# Patient Record
Sex: Male | Born: 1975 | Race: Black or African American | Hispanic: No
Health system: Southern US, Community
[De-identification: ages and names within clinical notes are randomized; demographics above are authoritative.]

## PROBLEM LIST (undated history)

## (undated) DIAGNOSIS — R569 Unspecified convulsions: Secondary | ICD-10-CM

## (undated) HISTORY — PX: CARDIAC SURGERY: SHX584

## (undated) HISTORY — DX: Unspecified convulsions: R56.9

---

## 2000-06-15 ENCOUNTER — Emergency Department (HOSPITAL_COMMUNITY): Admission: EM | Admit: 2000-06-15 | Discharge: 2000-06-15 | Payer: Self-pay

## 2002-10-28 ENCOUNTER — Emergency Department (HOSPITAL_COMMUNITY): Admission: EM | Admit: 2002-10-28 | Discharge: 2002-10-28 | Payer: Self-pay | Admitting: Emergency Medicine

## 2003-08-03 ENCOUNTER — Emergency Department (HOSPITAL_COMMUNITY): Admission: EM | Admit: 2003-08-03 | Discharge: 2003-08-03 | Payer: Self-pay | Admitting: Family Medicine

## 2003-08-03 ENCOUNTER — Emergency Department (HOSPITAL_COMMUNITY): Admission: EM | Admit: 2003-08-03 | Discharge: 2003-08-03 | Payer: Self-pay | Admitting: Emergency Medicine

## 2003-08-12 ENCOUNTER — Emergency Department (HOSPITAL_COMMUNITY): Admission: EM | Admit: 2003-08-12 | Discharge: 2003-08-12 | Payer: Self-pay | Admitting: Emergency Medicine

## 2009-02-18 ENCOUNTER — Emergency Department (HOSPITAL_COMMUNITY): Admission: EM | Admit: 2009-02-18 | Discharge: 2009-02-18 | Payer: Self-pay | Admitting: Emergency Medicine

## 2014-10-15 ENCOUNTER — Ambulatory Visit: Payer: Self-pay | Attending: Internal Medicine | Admitting: Internal Medicine

## 2014-10-15 ENCOUNTER — Encounter: Payer: Self-pay | Admitting: Internal Medicine

## 2014-10-15 VITALS — BP 118/81 | HR 62 | Temp 98.4°F | Resp 16 | Ht 69.0 in | Wt 186.6 lb

## 2014-10-15 DIAGNOSIS — H6123 Impacted cerumen, bilateral: Secondary | ICD-10-CM

## 2014-10-15 DIAGNOSIS — G40909 Epilepsy, unspecified, not intractable, without status epilepticus: Secondary | ICD-10-CM

## 2014-10-15 MED ORDER — LEVETIRACETAM 500 MG PO TABS: 500.0000 mg | ORAL_TABLET | Freq: Two times a day (BID) | ORAL | Status: DC
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
## 2014-10-15 NOTE — Progress Notes (Signed)
Patient recently moved back from [place]. Patient has history of seizures. Patient will need refferal to see neurologist.

## 2014-10-16 ENCOUNTER — Telehealth: Payer: Self-pay | Admitting: Internal Medicine

## 2014-10-16 ENCOUNTER — Ambulatory Visit: Payer: Self-pay | Attending: Internal Medicine

## 2014-10-16 NOTE — Telephone Encounter (Signed)
Patient needs to be referred to neurologist. Patient was last seen yesterday (10/15/14) and is hoping to be referred without an appointment if possible. Patient was approved for [employer]. Please f/u with patient.

## 2014-10-19 ENCOUNTER — Other Ambulatory Visit: Payer: Self-pay

## 2014-10-19 DIAGNOSIS — G40909 Epilepsy, unspecified, not intractable, without status epilepticus: Secondary | ICD-10-CM

## 2014-11-03 ENCOUNTER — Ambulatory Visit (INDEPENDENT_AMBULATORY_CARE_PROVIDER_SITE_OTHER): Payer: Self-pay | Admitting: Neurology

## 2014-11-03 ENCOUNTER — Encounter: Payer: Self-pay | Admitting: Neurology

## 2014-11-03 VITALS — BP 118/78 | HR 72 | Resp 16 | Ht 69.5 in | Wt 188.0 lb

## 2014-11-03 DIAGNOSIS — G40009 Localization-related (focal) (partial) idiopathic epilepsy and epileptic syndromes with seizures of localized onset, not intractable, without status epilepticus: Secondary | ICD-10-CM | POA: Insufficient documentation

## 2014-11-03 DIAGNOSIS — F411 Generalized anxiety disorder: Secondary | ICD-10-CM

## 2014-11-03 MED ORDER — LEVETIRACETAM 750 MG PO TABS: 750.0000 mg | ORAL_TABLET | Freq: Two times a day (BID) | ORAL | Status: DC
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
## 2014-11-04 ENCOUNTER — Ambulatory Visit (INDEPENDENT_AMBULATORY_CARE_PROVIDER_SITE_OTHER): Payer: Self-pay | Admitting: Neurology

## 2014-11-04 DIAGNOSIS — G40009 Localization-related (focal) (partial) idiopathic epilepsy and epileptic syndromes with seizures of localized onset, not intractable, without status epilepticus: Secondary | ICD-10-CM

## 2014-11-11 ENCOUNTER — Telehealth: Payer: Self-pay | Admitting: Family Medicine

## 2014-11-11 NOTE — Telephone Encounter (Signed)
-----   Message from Cameron Sprang, MD sent at 11/11/2014 12:21 PM EDT ----- Pls let him know EEG is normal, continue on higher dose Keppra. Thanks

## 2014-11-11 NOTE — Telephone Encounter (Signed)
Left msg with patient's mother to return my call.

## 2014-11-11 NOTE — Procedures (Signed)
ELECTROENCEPHALOGRAM REPORT  Date of Study: 11/04/2014  Patient's Name: Brian Ward MRN: [id number] Date of Birth: 1975/09/12  Referring Provider: Dr. Ellouise Newer  Clinical History: This is a 39 year old man with new onset seizures, he occasionally feels his heart flutter and something "going up my left side" prior to a seizure.   Medications: Keppra  Technical Summary: A multichannel digital 1-hour sleep-deprived EEG recording measured by the international 10-20 system with electrodes applied with paste and impedances below 5000 ohms performed in our laboratory with EKG monitoring in an awake and asleep patient.  Hyperventilation and photic stimulation were performed.  The digital EEG was referentially recorded, reformatted, and digitally filtered in a variety of bipolar and referential montages for optimal display.    Description: The patient is awake and asleep during the recording.  During maximal wakefulness, there is a symmetric, medium voltage 9.5 Hz posterior dominant rhythm that attenuates with eye opening.  The record is symmetric.  During drowsiness and sleep, there is an increase in theta slowing of the background.  Vertex waves and symmetric sleep spindles were seen.  Hyperventilation and photic stimulation did not elicit any abnormalities.  There were no epileptiform discharges or electrographic seizures seen.    EKG lead was unremarkable.  Impression: This 1-hour wake and sleep EEG is normal.    Clinical Correlation: A normal EEG does not exclude a clinical diagnosis of epilepsy.  If further clinical questions remain, prolonged EEG may be helpful.  Clinical correlation is advised.   Ellouise Newer, M.D.

## 2014-11-12 NOTE — Telephone Encounter (Signed)
PT returned your call from yesterday/Dawn CB# [phone]

## 2014-11-12 NOTE — Telephone Encounter (Signed)
I spoke with patient and notified him of result and advisement.

## 2014-11-20 ENCOUNTER — Telehealth: Payer: Self-pay | Admitting: Neurology
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
## 2014-12-07 ENCOUNTER — Ambulatory Visit (HOSPITAL_COMMUNITY): Payer: Self-pay | Admitting: Clinical

## 2014-12-15 ENCOUNTER — Encounter (HOSPITAL_COMMUNITY): Payer: Self-pay | Admitting: Clinical

## 2014-12-15 ENCOUNTER — Ambulatory Visit (INDEPENDENT_AMBULATORY_CARE_PROVIDER_SITE_OTHER): Payer: Self-pay | Admitting: Clinical

## 2014-12-15 DIAGNOSIS — F0631 Mood disorder due to known physiological condition with depressive features: Secondary | ICD-10-CM

## 2014-12-15 DIAGNOSIS — F418 Other specified anxiety disorders: Secondary | ICD-10-CM

## 2014-12-15 DIAGNOSIS — IMO0002 Reserved for concepts with insufficient information to code with codable children: Secondary | ICD-10-CM

## 2014-12-15 DIAGNOSIS — F064 Anxiety disorder due to known physiological condition: Secondary | ICD-10-CM
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
## 2015-01-04 ENCOUNTER — Encounter: Payer: Self-pay | Admitting: Internal Medicine

## 2015-01-04 ENCOUNTER — Ambulatory Visit: Payer: Self-pay | Attending: Internal Medicine | Admitting: Internal Medicine

## 2015-01-04 VITALS — BP 130/81 | HR 77 | Temp 97.8°F | Resp 17 | Ht 69.0 in | Wt 190.0 lb

## 2015-01-04 DIAGNOSIS — Z113 Encounter for screening for infections with a predominantly sexual mode of transmission: Secondary | ICD-10-CM

## 2015-01-04 DIAGNOSIS — B351 Tinea unguium: Secondary | ICD-10-CM

## 2015-01-04 DIAGNOSIS — D2272 Melanocytic nevi of left lower limb, including hip: Secondary | ICD-10-CM

## 2015-01-04 LAB — COMPLETE METABOLIC PANEL WITH GFR
ALBUMIN: 4.9 g/dL (ref 3.6–5.1)
ALT: 15 U/L (ref 9–46)
AST: 14 U/L (ref 10–40)
Alkaline Phosphatase: 87 U/L (ref 40–115)
BUN: 12 mg/dL (ref 7–25)
CALCIUM: 9.9 mg/dL (ref 8.6–10.3)
CO2: 29 mmol/L (ref 20–31)
Chloride: 101 mmol/L (ref 98–110)
Creat: 0.98 mg/dL (ref 0.60–1.35)
GFR, Est African American: >89 mL/min (ref >=60)
GFR, Est Non African American: >89 mL/min (ref >=60)
GLUCOSE: 81 mg/dL (ref 65–99)
POTASSIUM: 4 mmol/L (ref 3.5–5.3)
SODIUM: 139 mmol/L (ref 135–146)
TOTAL PROTEIN: 7.3 g/dL (ref 6.1–8.1)
Total Bilirubin: 0.7 mg/dL (ref 0.2–1.2)

## 2015-01-04 LAB — CBC WITH DIFFERENTIAL/PLATELET
Basophils Absolute: 0 10*3/uL (ref 0.0–0.1)
Basophils Relative: 0 % (ref 0–1)
Eosinophils Absolute: 0.2 10*3/uL (ref 0.0–0.7)
Eosinophils Relative: 3 % (ref 0–5)
HEMATOCRIT: 40.6 % (ref 39.0–52.0)
HEMOGLOBIN: 13.8 g/dL (ref 13.0–17.0)
LYMPHS PCT: 46 % (ref 12–46)
Lymphs Abs: 2.4 10*3/uL (ref 0.7–4.0)
MCH: 29.1 pg (ref 26.0–34.0)
MCHC: 34 g/dL (ref 30.0–36.0)
MCV: 85.7 fL (ref 78.0–100.0)
MONO ABS: 0.5 10*3/uL (ref 0.1–1.0)
MPV: 9.1 fL (ref 8.6–12.4)
Monocytes Relative: 9 % (ref 3–12)
NEUTROS ABS: 2.2 10*3/uL (ref 1.7–7.7)
Neutrophils Relative %: 42 % — ABNORMAL LOW (ref 43–77)
Platelets: 224 10*3/uL (ref 150–400)
RBC: 4.74 MIL/uL (ref 4.22–5.81)
RDW: 14.7 % (ref 11.5–15.5)
WBC: 5.3 10*3/uL (ref 4.0–10.5)

## 2015-01-04 NOTE — Progress Notes (Signed)
Patient ID: Brian Ward, male   DOB: 04-Mar-1975, 39 y.o.   MRN: [id number]  CC: follow up  HPI: Brian Ward is a 39 y.o. male here today for a follow up visit.  Patient has past medical history of seizure disorder. Patient reports that he noticed a mole on his left leg 8 months ago. The mole has recently become larger in size and changed in color. Slight tenderness of mole when it is touched.   Patient reports that he has had long thick dark toenails for several years. He reports taking the nails off several years ago but it grew back thick and long.   Patient reports that he is in a new relationship and would like to have STD screening. He denies any dysuria or penile discharge.   No Known Allergies Past Medical History  Diagnosis Date  . Seizures ([place])    Current Outpatient Prescriptions on File Prior to Visit  Medication Sig Dispense Refill  . levETIRAcetam (KEPPRA) 750 MG tablet Take 1 tablet (750 mg total) by mouth 2 (two) times daily. 60 tablet 11   No current facility-administered medications on file prior to visit.   Family History  Problem Relation Age of Onset  . Seizures Maternal Aunt   . Sickle cell anemia Maternal Aunt   . Diabetes Mother    Social History   Social History  . Marital Status: Married    Spouse Name: N/A  . Number of Children: 5  . Years of Education: N/A   Occupational History  . Unemployed    Social History Main Topics  . Smoking status: Current Every Day Smoker -- 0.25 packs/day    Types: Cigarettes  . Smokeless tobacco: Never Used     Comment: smokes marijuana occasionally  . Alcohol Use: 0.0 oz/week    0 Standard drinks or equivalent per week     Comment: Occ   . Drug Use: No  . Sexual Activity: Not Currently   Other Topics Concern  . Not on file   Social History Narrative    Review of Systems: Other than what is stated in HPI, all other systems are negative.   Objective:   Filed Vitals:   01/04/15 1418  BP: 130/81    Pulse: 77  Temp: 97.8 F (36.6 C)  Resp: 17    Physical Exam  Constitutional: He is oriented to person, place, and time.  Cardiovascular: Normal rate, regular rhythm and normal heart sounds.   Pulmonary/Chest: Effort normal and breath sounds normal.  Neurological: He is alert and oriented to person, place, and time.  Skin: Skin is warm and dry.     Onychomycosis bilaterally     No results found for: WBC, HGB, HCT, MCV, PLT No results found for: CREATININE, BUN, NA, K, CL, CO2  No results found for: HGBA1C Lipid Panel  No results found for: CHOL, TRIG, HDL, CHOLHDL, VLDL, LDLCALC     Assessment and plan:   Shed was seen today for nevus.  Diagnoses and all orders for this visit:  Nevus of lower leg, left -     Ambulatory referral to Dermatology  Onychomycosis -     Ambulatory referral to [place] [facility]  Screening for STD (sexually transmitted disease) -     Urine cytology ancillary only -     HIV antibody (with reflex) -     CBC with Differential  Return if symptoms worsen or fail to improve.  Lance Bosch, [facility] [phone] 01/04/2015, 2:52 PM

## 2015-01-04 NOTE — Progress Notes (Signed)
Patient complains of having a mole to his lower left leg that has gotten bigger And has changed its color Patient states the mole has been there for about eight months but recently changed

## 2015-01-05 LAB — URINE CYTOLOGY ANCILLARY ONLY
Chlamydia: NEGATIVE
NEISSERIA GONORRHEA: NEGATIVE
Trichomonas: NEGATIVE

## 2015-01-05 LAB — HIV ANTIBODY (ROUTINE TESTING W REFLEX): HIV 1&2 Ab, 4th Generation: NONREACTIVE

## 2015-01-06 ENCOUNTER — Telehealth: Payer: Self-pay

## 2015-01-06 NOTE — Telephone Encounter (Signed)
-----   Message from Lance Bosch, NP sent at 01/05/2015  3:25 PM EST ----- Negative for STD's

## 2015-01-06 NOTE — Telephone Encounter (Signed)
Spoke with patient and he is aware of his lab results 

## 2015-01-18 ENCOUNTER — Ambulatory Visit (HOSPITAL_COMMUNITY): Payer: Self-pay | Admitting: Clinical

## 2015-01-21 ENCOUNTER — Ambulatory Visit: Payer: Self-pay | Admitting: Neurology

## 2015-01-25 ENCOUNTER — Ambulatory Visit (HOSPITAL_COMMUNITY): Payer: Self-pay | Admitting: Clinical

## 2015-02-11 MED FILL — levETIRAcetam 750 MG TABS: 750 | 30 days supply | Qty: 60 | Fill #3

## 2015-02-15 ENCOUNTER — Ambulatory Visit: Payer: Self-pay | Admitting: Neurology

## 2015-03-01 ENCOUNTER — Ambulatory Visit: Payer: Self-pay | Admitting: Neurology

## 2015-03-10 ENCOUNTER — Ambulatory Visit: Payer: Self-pay

## 2015-03-16 MED FILL — levETIRAcetam 750 MG TABS: 750 | 30 days supply | Qty: 60 | Fill #4

## 2015-03-22 ENCOUNTER — Emergency Department (HOSPITAL_COMMUNITY)
Admission: EM | Admit: 2015-03-22 | Discharge: 2015-03-22 | Disposition: A | Discharge status: Home / Self Care | Payer: Self-pay | Attending: Emergency Medicine | Admitting: Emergency Medicine

## 2015-03-22 ENCOUNTER — Encounter (HOSPITAL_COMMUNITY): Payer: Self-pay | Admitting: Emergency Medicine

## 2015-03-22 ENCOUNTER — Emergency Department (HOSPITAL_COMMUNITY): Payer: Self-pay

## 2015-03-22 DIAGNOSIS — Z79899 Other long term (current) drug therapy: Secondary | ICD-10-CM | POA: Insufficient documentation

## 2015-03-22 DIAGNOSIS — M25461 Effusion, right knee: Secondary | ICD-10-CM | POA: Insufficient documentation

## 2015-03-22 DIAGNOSIS — M25561 Pain in right knee: Secondary | ICD-10-CM | POA: Insufficient documentation

## 2015-03-22 DIAGNOSIS — F17210 Nicotine dependence, cigarettes, uncomplicated: Secondary | ICD-10-CM | POA: Insufficient documentation

## 2015-03-22 MED ORDER — NAPROXEN 500 MG PO TABS: 500.0000 mg | ORAL_TABLET | Freq: Two times a day (BID) | ORAL | Status: DC

## 2015-03-22 NOTE — ED Notes (Signed)
Pt. reports right knee pain with swelling onset last week , denies injury/ambulatory , pain increases when walking and bending .

## 2015-03-22 NOTE — Discharge Instructions (Signed)

## 2015-03-22 NOTE — ED Provider Notes (Signed)
CSN: [id number]     Arrival date & time 03/22/15  1943 History  By signing my name below, I, Starleen Arms, attest that this documentation has been prepared under the direction and in the presence of Etta Quill, NP. Electronically Signed: Starleen Arms ED Scribe. 03/22/2015. 8:38 PM.    Chief Complaint  Patient presents with  . Knee Pain   The history is provided by the patient. No language interpreter was used.   HPI Comments: Brian Ward is a 40 y.o. male who presents to the Emergency Department complaining of worsening right knee pain and swelling onset 1.5 weeks ago without injury or known cause.  The pain is worse walking.  He denies new stressors the knee or recently increased use of the knee.  The patient reports a hx of seizures 2 years ago but has had none since and denies hx of HTN, DM.    Past Medical History  Diagnosis Date  . Seizures ([place])    History reviewed. No pertinent past surgical history. Family History  Problem Relation Age of Onset  . Seizures Maternal Aunt   . Sickle cell anemia Maternal Aunt   . Diabetes Mother    Social History  Substance Use Topics  . Smoking status: Current Every Day Smoker -- 0.25 packs/day    Types: Cigarettes  . Smokeless tobacco: Never Used     Comment: smokes marijuana occasionally  . Alcohol Use: 0.0 oz/week    0 Standard drinks or equivalent per week     Comment: Occ     Review of Systems  Musculoskeletal: Positive for arthralgias.  All other systems reviewed and are negative.  A complete 10 system review of systems was obtained and all systems are negative except as noted in the HPI and PMH.   Allergies  Review of patient's allergies indicates no known allergies.  Home Medications   Prior to Admission medications   Medication Sig Start Date End Date Taking? Authorizing Provider  levETIRAcetam (KEPPRA) 750 MG tablet Take 1 tablet (750 mg total) by mouth 2 (two) times daily. 11/03/14   Cameron Sprang, MD   BP 111/73  mmHg  Pulse 67  Temp(Src) 98 F (36.7 C) (Oral)  Resp 20  SpO2 99% Physical Exam  Constitutional: He is oriented to person, place, and time. He appears well-developed and well-nourished. No distress.  HENT:  Head: Normocephalic and atraumatic.  Eyes: Conjunctivae and EOM are normal.  Neck: Neck supple. No tracheal deviation present.  Cardiovascular: Normal rate.   Pulmonary/Chest: Effort normal. No respiratory distress.  Musculoskeletal: Normal range of motion.  Edematous right knee.  Increased pain with ROM.    Neurological: He is alert and oriented to person, place, and time.  Skin: Skin is warm and dry.  Psychiatric: He has a normal mood and affect. His behavior is normal.  Nursing note and vitals reviewed.   ED Course  Procedures (including critical care time)  DIAGNOSTIC STUDIES: Oxygen Saturation is 99% on RA, normal by my interpretation.    COORDINATION OF CARE:  8:56 PM Discussed plans to order imaging of right knee. Patient acknowledges and agrees with plan.     Labs Review Labs Reviewed - No data to display  Imaging Review Dg Knee Complete 4 Views Right  03/22/2015  CLINICAL DATA:  40 year old male with right knee swelling. EXAM: RIGHT KNEE - COMPLETE 4+ VIEW COMPARISON:  None. FINDINGS: There is no acute fracture or dislocation. A 2 mm radiopaque focus in the lateral aspect  of the lateral compartment may represent a focal area of meniscal calcification. A small suprapatellar effusion noted. The soft tissues are grossly unremarkable. IMPRESSION: No acute fracture or dislocation. Small suprapatellar effusion. Electronically Signed   By: Anner Crete M.D.   On: 03/22/2015 21:25   I have personally reviewed and evaluated these images and lab results as part of my medical decision-making.   EKG Interpretation None     Radiology results reviewed and shared with patient.   Knee sleeve, crutches, anti-inflammatory. Follow-up with orthopedics. MDM   Final  diagnoses:  None    Right knee pain.  I personally performed the services described in this documentation, which was scribed in my presence. The recorded information has been reviewed and is accurate.    Etta Quill, NP 03/23/15 [id number]  Ezequiel Essex, MD 03/23/15 [phone]

## 2015-04-13 MED FILL — levETIRAcetam 750 MG TABS: 750 | 30 days supply | Qty: 60 | Fill #5

## 2015-05-05 MED FILL — levETIRAcetam 750 MG TABS: 750 | 30 days supply | Qty: 60 | Fill #6

## 2015-06-23 ENCOUNTER — Emergency Department (HOSPITAL_COMMUNITY)
Admission: EM | Admit: 2015-06-23 | Discharge: 2015-06-23 | Disposition: A | Discharge status: Home / Self Care | Payer: Self-pay | Attending: Emergency Medicine | Admitting: Emergency Medicine

## 2015-06-23 ENCOUNTER — Emergency Department (HOSPITAL_COMMUNITY): Payer: Self-pay

## 2015-06-23 ENCOUNTER — Encounter (HOSPITAL_COMMUNITY): Payer: Self-pay | Admitting: Emergency Medicine

## 2015-06-23 DIAGNOSIS — F17210 Nicotine dependence, cigarettes, uncomplicated: Secondary | ICD-10-CM | POA: Insufficient documentation

## 2015-06-23 DIAGNOSIS — K529 Noninfective gastroenteritis and colitis, unspecified: Secondary | ICD-10-CM | POA: Insufficient documentation

## 2015-06-23 DIAGNOSIS — Z79899 Other long term (current) drug therapy: Secondary | ICD-10-CM | POA: Insufficient documentation

## 2015-06-23 DIAGNOSIS — R5383 Other fatigue: Secondary | ICD-10-CM | POA: Insufficient documentation

## 2015-06-23 LAB — COMPREHENSIVE METABOLIC PANEL
ALBUMIN: 4 g/dL (ref 3.5–5.0)
ALK PHOS: 79 U/L (ref 38–126)
ALT: 10 U/L — ABNORMAL LOW (ref 17–63)
ANION GAP: 14 (ref 5–15)
AST: 13 U/L — ABNORMAL LOW (ref 15–41)
BUN: 7 mg/dL (ref 6–20)
CALCIUM: 9.4 mg/dL (ref 8.9–10.3)
CHLORIDE: 101 mmol/L (ref 101–111)
CO2: 24 mmol/L (ref 22–32)
Creatinine, Ser: 0.94 mg/dL (ref 0.61–1.24)
GFR calc non Af Amer: >60 mL/min (ref >60)
GLUCOSE: 97 mg/dL (ref 65–99)
Potassium: 3.7 mmol/L (ref 3.5–5.1)
SODIUM: 139 mmol/L (ref 135–145)
Total Bilirubin: 1.1 mg/dL (ref 0.3–1.2)
Total Protein: 7 g/dL (ref 6.5–8.1)

## 2015-06-23 LAB — LIPASE, BLOOD: Lipase: 18 U/L (ref 11–51)

## 2015-06-23 LAB — CBC
HEMATOCRIT: 41.2 % (ref 39.0–52.0)
Hemoglobin: 13.5 g/dL (ref 13.0–17.0)
MCH: 28.3 pg (ref 26.0–34.0)
MCHC: 32.8 g/dL (ref 30.0–36.0)
MCV: 86.4 fL (ref 78.0–100.0)
PLATELETS: 220 10*3/uL (ref 150–400)
RBC: 4.77 MIL/uL (ref 4.22–5.81)
RDW: 13.6 % (ref 11.5–15.5)
WBC: 3.6 10*3/uL — AB (ref 4.0–10.5)

## 2015-06-23 LAB — I-STAT TROPONIN, ED: Troponin i, poc: 0 ng/mL (ref 0.00–0.08)

## 2015-06-23 MED ORDER — SODIUM CHLORIDE 0.9 % IV BOLUS (SEPSIS)
1000.0000 mL | Freq: Once | INTRAVENOUS | Status: AC
  Administered 2015-06-23: 1000 mL via INTRAVENOUS

## 2015-06-23 MED ORDER — ASPIRIN 81 MG PO CHEW
324.0000 mg | CHEWABLE_TABLET | Freq: Once | ORAL | Status: AC
  Administered 2015-06-23: 324 mg via ORAL
  Filled 2015-06-23: qty 4

## 2015-06-23 MED ORDER — GI COCKTAIL ~~LOC~~
30.0000 mL | Freq: Once | ORAL | Status: AC
  Administered 2015-06-23: 30 mL via ORAL
  Filled 2015-06-23: qty 30

## 2015-06-23 MED ORDER — MORPHINE SULFATE (PF) 4 MG/ML IV SOLN
4.0000 mg | Freq: Once | INTRAVENOUS | Status: AC
  Administered 2015-06-23: 4 mg via INTRAVENOUS
  Filled 2015-06-23: qty 1

## 2015-06-23 MED ORDER — IOPAMIDOL (ISOVUE-300) INJECTION 61%
INTRAVENOUS | Status: AC
  Administered 2015-06-23: 100 mL
  Filled 2015-06-23: qty 100

## 2015-06-23 MED ORDER — ONDANSETRON HCL 4 MG/2ML IJ SOLN
4.0000 mg | Freq: Once | INTRAMUSCULAR | Status: AC
  Administered 2015-06-23: 4 mg via INTRAVENOUS
  Filled 2015-06-23: qty 2

## 2015-06-23 NOTE — ED Provider Notes (Signed)
CSN: [id number]     Arrival date & time 06/23/15  0957 History   First MD Initiated Contact with Patient 06/23/15 1012     Chief Complaint  Patient presents with  . Chest Pain  . Fatigue     (Consider location/radiation/quality/duration/timing/severity/associated sxs/prior Treatment) Patient is a 40 y.o. male presenting with chest pain and abdominal pain. The history is provided by the patient.  Chest Pain Associated symptoms: no abdominal pain, no fever, no headache, no palpitations, no shortness of breath and not vomiting   Abdominal Pain Pain location:  Generalized Pain quality: sharp and shooting   Pain radiates to:  Does not radiate Pain severity:  Moderate Onset quality:  Sudden Duration:  2 days Timing:  Intermittent Progression:  Worsening Chronicity:  New Relieved by:  Nothing Worsened by:  Nothing tried Ineffective treatments:  None tried Associated symptoms: no chest pain, no chills, no diarrhea, no fever, no shortness of breath and no vomiting    40 yo M With a chief complaint of abdominal pain. States this is diffuse more in the epigastrium. Going on for the past couple days. Off and on and now consistent. Also is been waking up in the morning with substernal chest pressure. Resolves after about 30 minutes. Denies any cough congestion fevers or chills. Denies any vomiting or diarrhea. Denies food related symptoms. Denies exertional symptoms.  Past Medical History  Diagnosis Date  . Seizures ([place])    History reviewed. No pertinent past surgical history. Family History  Problem Relation Age of Onset  . Seizures Maternal Aunt   . Sickle cell anemia Maternal Aunt   . Diabetes Mother    Social History  Substance Use Topics  . Smoking status: Current Every Day Smoker -- 0.25 packs/day    Types: Cigarettes  . Smokeless tobacco: Never Used     Comment: smokes marijuana occasionally  . Alcohol Use: 0.0 oz/week    0 Standard drinks or equivalent per week   Comment: Occ     Review of Systems  Constitutional: Negative for fever and chills.  HENT: Negative for congestion and facial swelling.   Eyes: Negative for discharge and visual disturbance.  Respiratory: Negative for shortness of breath.   Cardiovascular: Negative for chest pain and palpitations.  Gastrointestinal: Negative for vomiting, abdominal pain and diarrhea.  Musculoskeletal: Negative for myalgias and arthralgias.  Skin: Negative for color change and rash.  Neurological: Negative for tremors, syncope and headaches.  Psychiatric/Behavioral: Negative for confusion and dysphoric mood.      Allergies  Review of patient's allergies indicates no known allergies.  Home Medications   Prior to Admission medications   Medication Sig Start Date End Date Taking? Authorizing Provider  levETIRAcetam (KEPPRA) 750 MG tablet Take 1 tablet (750 mg total) by mouth 2 (two) times daily. 11/03/14  Yes Cameron Sprang, MD  naproxen (NAPROSYN) 500 MG tablet Take 1 tablet (500 mg total) by mouth 2 (two) times daily. Patient not taking: Reported on 06/23/2015 03/22/15   Etta Quill, NP   BP 120/70 mmHg  Pulse 64  Temp(Src) 98 F (36.7 C) (Oral)  Resp 10  Ht 5' 9.5" (1.765 m)  Wt 182 lb (82.555 kg)  BMI 26.50 kg/m2  SpO2 100% Physical Exam  Constitutional: He is oriented to person, place, and time. He appears well-developed and well-nourished.  HENT:  Head: Normocephalic and atraumatic.  Eyes: EOM are normal. Pupils are equal, round, and reactive to light.  Neck: Normal range of motion. Neck supple.  No JVD present.  Cardiovascular: Normal rate and regular rhythm.  Exam reveals no gallop and no friction rub.   No murmur heard. Pulmonary/Chest: No respiratory distress. He has no wheezes.  Abdominal: He exhibits no distension. There is no rebound and no guarding.  Musculoskeletal: Normal range of motion.  Neurological: He is alert and oriented to person, place, and time.  Skin: No rash noted.  No pallor.  Psychiatric: He has a normal mood and affect. His behavior is normal.  Nursing note and vitals reviewed.   ED Course  Procedures (including critical care time) Labs Review Labs Reviewed  CBC - Abnormal; Notable for the following:    WBC 3.6 (*)    All other components within normal limits  COMPREHENSIVE METABOLIC PANEL - Abnormal; Notable for the following:    AST 13 (*)    ALT 10 (*)    All other components within normal limits  LIPASE, BLOOD  I-STAT TROPOININ, ED  Randolm Idol, ED    Imaging Review Dg Chest 2 View  06/23/2015  CLINICAL DATA:  Chest abdominal pain.  Fatigue. EXAM: CHEST  2 VIEW COMPARISON:  08/12/2003 FINDINGS: The cardiomediastinal silhouette is within normal limits. Surgical clips are again seen in the right peritracheal region. The lungs are well inflated without evidence of airspace consolidation, edema, pleural effusion, or pneumothorax. Small nodular density projecting over the left lower lung on the PA image is unchanged from the prior study and may represent a nipple shadow or stable (and benign) pulmonary nodule. No acute osseous abnormality is identified. IMPRESSION: No active cardiopulmonary disease. Electronically Signed   By: Logan Bores M.D.   On: 06/23/2015 10:41   Ct Abdomen Pelvis W Contrast  06/23/2015  CLINICAL DATA:  Constipation and loss of appetite for 5 days. Abdominal pain worse in the right lower quadrant. EXAM: CT ABDOMEN AND PELVIS WITH CONTRAST TECHNIQUE: Multidetector CT imaging of the abdomen and pelvis was performed using the standard protocol following bolus administration of intravenous contrast. CONTRAST:  129mL ISOVUE-300 IOPAMIDOL (ISOVUE-300) INJECTION 61% COMPARISON:  None. FINDINGS: Lower chest and abdominal wall:  No contributory findings. Hepatobiliary: No focal liver abnormality.No evidence of biliary obstruction or stone. Pancreas: Unremarkable. Spleen: Unremarkable. Adrenals/Urinary Tract: Mild thickening of the  right adrenal body without discrete nodule. Presumed sub cm bilateral renal cortical cysts. Otherwise symmetric renal enhancement. No hydronephrosis or stone. Unremarkable bladder. Reproductive:No pathologic findings. Stomach/Bowel: There is hypervascular appearance to the mucosa of the ileum with borderline wall thickening. The associated mesenteric fat is mildly hazy and there are prominent mesenteric lymph nodes. No ulceration or stricture is seen. No skip areas of inflammation. No appendicitis. Vascular/Lymphatic: Extensive aortic atherosclerosis with predominately noncalcified plaque. No acute vascular abnormality. Mild reactive appearing enlargement of mesenteric lymph nodes. Peritoneal: Trace pelvic fluid which is considered reactive. Musculoskeletal: No sacroiliitis or spondyloarthropathy . IMPRESSION: 1. Suspect mild enteritis. No appendicitis. 2. Age advanced aortic atherosclerosis. Electronically Signed   By: Monte Fantasia M.D.   On: 06/23/2015 12:19   I have personally reviewed and evaluated these images and lab results as part of my medical decision-making.   EKG Interpretation   Date/Time:  Wednesday Jun 23 2015 10:04:09 EDT Ventricular Rate:  79 PR Interval:  171 QRS Duration: 86 QT Interval:  363 QTC Calculation: 416 R Axis:   76 Text Interpretation:  Sinus rhythm No old tracing to compare Confirmed by  Ayala Ribble MD, DANIEL [id number]) on 06/23/2015 11:15:06 AM      MDM   Final  diagnoses:  Enteritis    40 yo M with a chief complaint of abdominal pain. Patient with right lower quadrant tenderness on exam. Will obtain a CT scan of abdomen and pelvis. Chest pain transient and completely resolved by the time he came to the ED. EKG is unremarkable. Troponin negative.  CT scan with enteritis.  PCP follow up.   3:33 PM:  I have discussed the diagnosis/risks/treatment options with the patient and believe the pt to be eligible for discharge home to follow-up with PCP. We also discussed  returning to the ED immediately if new or worsening sx occur. We discussed the sx which are most concerning (e.g., sudden worsening pain, fever, inability to tolerate by mouth) that necessitate immediate return. Medications administered to the patient during their visit and any new prescriptions provided to the patient are listed below.  Medications given during this visit Medications  aspirin chewable tablet 324 mg (324 mg Oral Given 06/23/15 1017)  morphine 4 MG/ML injection 4 mg (4 mg Intravenous Given 06/23/15 1103)  ondansetron (ZOFRAN) injection 4 mg (4 mg Intravenous Given 06/23/15 1103)  sodium chloride 0.9 % bolus 1,000 mL (0 mLs Intravenous Stopped 06/23/15 1301)  gi cocktail (Maalox,Lidocaine,Donnatal) (30 mLs Oral Given 06/23/15 1043)  iopamidol (ISOVUE-300) 61 % injection (100 mLs  Contrast Given 06/23/15 1158)    Discharge Medication List as of 06/23/2015 12:34 PM      The patient appears reasonably screen and/or stabilized for discharge and I doubt any other medical condition or other [facility] requiring further screening, evaluation, or treatment in the ED at this time prior to discharge.    Deno Etienne, DO 06/23/15 1533

## 2015-06-23 NOTE — ED Notes (Addendum)
Pt reports fatigue since Sunday. Pt also reports abd pain and CP worse in the mornings. Pt alert x4. NAD at this time.

## 2015-06-28 MED FILL — levETIRAcetam 750 MG TABS: 750 | 30 days supply | Qty: 60 | Fill #7

## 2015-08-25 ENCOUNTER — Emergency Department (HOSPITAL_COMMUNITY)
Admission: EM | Admit: 2015-08-25 | Discharge: 2015-08-25 | Disposition: A | Discharge status: Home / Self Care | Payer: No Typology Code available for payment source | Attending: Emergency Medicine | Admitting: Emergency Medicine

## 2015-08-25 ENCOUNTER — Encounter (HOSPITAL_COMMUNITY): Payer: Self-pay | Admitting: Emergency Medicine

## 2015-08-25 ENCOUNTER — Emergency Department (HOSPITAL_COMMUNITY): Payer: No Typology Code available for payment source

## 2015-08-25 DIAGNOSIS — F17210 Nicotine dependence, cigarettes, uncomplicated: Secondary | ICD-10-CM | POA: Insufficient documentation

## 2015-08-25 DIAGNOSIS — G43809 Other migraine, not intractable, without status migrainosus: Secondary | ICD-10-CM | POA: Diagnosis not present

## 2015-08-25 LAB — BASIC METABOLIC PANEL
Anion gap: 7 (ref 5–15)
BUN: 11 mg/dL (ref 6–20)
CHLORIDE: 105 mmol/L (ref 101–111)
CO2: 26 mmol/L (ref 22–32)
CREATININE: 0.82 mg/dL (ref 0.61–1.24)
Calcium: 9.4 mg/dL (ref 8.9–10.3)
GFR calc non Af Amer: >60 mL/min (ref >60)
Glucose, Bld: 80 mg/dL (ref 65–99)
Potassium: 3.7 mmol/L (ref 3.5–5.1)
SODIUM: 138 mmol/L (ref 135–145)

## 2015-08-25 LAB — CBC WITH DIFFERENTIAL/PLATELET
BASOS ABS: 0 10*3/uL (ref 0.0–0.1)
BASOS PCT: 0 %
EOS ABS: 0.2 10*3/uL (ref 0.0–0.7)
Eosinophils Relative: 4 %
HCT: 40.5 % (ref 39.0–52.0)
Hemoglobin: 13.5 g/dL (ref 13.0–17.0)
LYMPHS ABS: 2.3 10*3/uL (ref 0.7–4.0)
LYMPHS PCT: 47 %
MCH: 29.1 pg (ref 26.0–34.0)
MCHC: 33.3 g/dL (ref 30.0–36.0)
MCV: 87.3 fL (ref 78.0–100.0)
MONOS PCT: 11 %
Monocytes Absolute: 0.6 10*3/uL (ref 0.1–1.0)
NEUTROS PCT: 38 %
Neutro Abs: 1.9 10*3/uL (ref 1.7–7.7)
Platelets: 237 10*3/uL (ref 150–400)
RBC: 4.64 MIL/uL (ref 4.22–5.81)
RDW: 14 % (ref 11.5–15.5)
WBC: 5 10*3/uL (ref 4.0–10.5)

## 2015-08-25 MED ORDER — PROCHLORPERAZINE EDISYLATE 5 MG/ML IJ SOLN
10.0000 mg | Freq: Once | INTRAMUSCULAR | Status: AC
  Administered 2015-08-25: 10 mg via INTRAVENOUS
  Filled 2015-08-25: qty 2

## 2015-08-25 NOTE — ED Notes (Signed)
Per patient, he has a migraine that puts pressure on his left side of head and eye.  He is weak.  No n/v/d.  Denies blackouts and/or LOC.

## 2015-08-25 NOTE — ED Notes (Signed)
Patient transported to CT 

## 2015-08-25 NOTE — Discharge Instructions (Signed)

## 2015-08-25 NOTE — ED Notes (Signed)
Pt requesting to be discharged at this time. Dr.Knapp notified and IV access removed from left hand. Pt able to eat and drink at this time. Pt reports improvement in pain.

## 2015-08-25 NOTE — ED Notes (Signed)
Pt reports understanding of discharge information. No questions at time of discharge 

## 2015-08-25 NOTE — ED Provider Notes (Signed)
CSN: [id number]     Arrival date & time 08/25/15  1601 History   First MD Initiated Contact with Patient 08/25/15 1754     Chief Complaint  Patient presents with  . Migraine    HPI Pt started having a headache today on the left side of his head while he was at work around 1:30.  It has stayed on the left side and moved to the right.  He also feels weak all over.  No fevers.  No injuries.  No neck pain.  No numbness.  He was working out in the heat today when this happened.  He did not necessarily feel like he was getting overheated.  He feels like he might have a seizure but he has not had one.  His sx are getting better but not completely gone.  The light was bothering his eyes.  No history of seizure. Past Medical History  Diagnosis Date  . Seizures ([place])    History reviewed. No pertinent past surgical history. Family History  Problem Relation Age of Onset  . Seizures Maternal Aunt   . Sickle cell anemia Maternal Aunt   . Diabetes Mother    Social History  Substance Use Topics  . Smoking status: Current Every Day Smoker -- 0.25 packs/day    Types: Cigarettes  . Smokeless tobacco: Never Used     Comment: smokes marijuana occasionally  . Alcohol Use: 0.0 oz/week    0 Standard drinks or equivalent per week     Comment: Occ     Review of Systems  All other systems reviewed and are negative.     Allergies  Review of patient's allergies indicates no known allergies.  Home Medications   Prior to Admission medications   Medication Sig Start Date End Date Taking? Authorizing Provider  levETIRAcetam (KEPPRA) 750 MG tablet Take 1 tablet (750 mg total) by mouth 2 (two) times daily. 11/03/14   Cameron Sprang, MD  naproxen (NAPROSYN) 500 MG tablet Take 1 tablet (500 mg total) by mouth 2 (two) times daily. Patient not taking: Reported on 06/23/2015 03/22/15   Etta Quill, NP   BP 116/83 mmHg  Pulse 61  Temp(Src) 98.2 F (36.8 C) (Oral)  Resp 16  Ht 5\' 10"  (1.778 m)  Wt 87.091 kg   BMI 27.55 kg/m2  SpO2 100% Physical Exam  Constitutional: He appears well-developed and well-nourished. No distress.  HENT:  Head: Normocephalic and atraumatic.  Right Ear: External ear normal.  Left Ear: External ear normal.  Eyes: Conjunctivae are normal. Right eye exhibits no discharge. Left eye exhibits no discharge. No scleral icterus.  Neck: Neck supple. No tracheal deviation present.  Cardiovascular: Normal rate, regular rhythm and intact distal pulses.   Pulmonary/Chest: Effort normal and breath sounds normal. No stridor. No respiratory distress. He has no wheezes. He has no rales.  Abdominal: Soft. Bowel sounds are normal. He exhibits no distension. There is no tenderness. There is no rebound and no guarding.  Musculoskeletal: He exhibits no edema or tenderness.  Neurological: He is alert. He has normal strength. No cranial nerve deficit (no facial droop, extraocular movements intact, no slurred speech) or sensory deficit. He exhibits normal muscle tone. He displays no seizure activity. Coordination normal.  Skin: Skin is warm and dry. No rash noted.  Psychiatric: He has a normal mood and affect.  Nursing note and vitals reviewed.   ED Course  Procedures (including critical care time) Labs Review Labs Reviewed  CBC WITH DIFFERENTIAL/PLATELET  BASIC METABOLIC PANEL    Imaging Review Ct Head Wo Contrast  08/25/2015  CLINICAL DATA:  40 y/o M; headache that this pressure on the left side effect and I have. EXAM: CT HEAD WITHOUT CONTRAST TECHNIQUE: Contiguous axial images were obtained from the base of the skull through the vertex without intravenous contrast. COMPARISON:  None. FINDINGS: Brain: No evidence of acute infarction, hemorrhage, extra-axial collection, ventriculomegaly, or mass effect. Vascular: No hyperdense vessel or unexpected calcification. Skull: Negative for fracture or focal lesion. Sinuses/Orbits: No acute findings. Other: None. IMPRESSION: No acute intracranial  abnormality. Electronically Signed   By: Kristine Garbe M.D.   On: 08/25/2015 19:34   I have personally reviewed and evaluated these images and lab results as part of my medical decision-making.    MDM   Final diagnoses:  Other migraine without status migrainosus, not intractable    Doubt SAH, meningitis. Suspect sx are related to a migraine headache.  He improved with ED treatment.  Feels much better and wants to go home.    Dorie Rank, MD 08/25/15 [phone]

## 2015-08-26 MED FILL — levETIRAcetam 750 MG TABS: 750 | 30 days supply | Qty: 60 | Fill #8

## 2015-09-21 ENCOUNTER — Ambulatory Visit (HOSPITAL_COMMUNITY)
Admission: EM | Admit: 2015-09-21 | Discharge: 2015-09-21 | Disposition: A | Discharge status: Home / Self Care | Payer: No Typology Code available for payment source | Attending: Family Medicine | Admitting: Family Medicine

## 2015-09-21 ENCOUNTER — Encounter (HOSPITAL_COMMUNITY): Payer: Self-pay | Admitting: Emergency Medicine

## 2015-09-21 DIAGNOSIS — S0501XA Injury of conjunctiva and corneal abrasion without foreign body, right eye, initial encounter: Secondary | ICD-10-CM | POA: Diagnosis not present

## 2015-09-21 MED ORDER — GENTAMICIN SULFATE 0.3 % OP SOLN: 2.0000 [drp] | OPHTHALMIC | 0 refills | Status: AC

## 2015-09-21 MED ORDER — TETRACAINE HCL 0.5 % OP SOLN
OPHTHALMIC | Status: AC
  Filled 2015-09-21: qty 2

## 2015-09-21 MED FILL — GENTAMICIN 3 MG/ML EYE DRP: 0.3 | 15 days supply | Qty: 15 | Fill #0

## 2015-09-21 NOTE — ED Triage Notes (Signed)
The patient presented to the [facility] with a complaint of an itchy, watery and painful right eye that started yesterday. The patient denied any known injuries. The patient presented with a patch on his eye.

## 2015-09-21 NOTE — ED Provider Notes (Signed)
CSN: [id number]     Arrival date & time 09/21/15  1234 History   First MD Initiated Contact with Patient 09/21/15 1352     Chief Complaint  Patient presents with  . Eye Problem   (Consider location/radiation/quality/duration/timing/severity/associated sxs/prior Treatment) 40 year old male presents with right eye pain and tearing that started yesterday. He woke up to the discomfort. No purulent discharge. No URI symptoms. Does not recall any injury to the eye. Does not wear contacts.  Has not tried any eye drops. Currently wearing an eye patch to help with irritation from bright light that does help.    The history is provided by the patient.    Past Medical History:  Diagnosis Date  . Seizures ([place])    History reviewed. No pertinent surgical history. Family History  Problem Relation Age of Onset  . Seizures Maternal Aunt   . Sickle cell anemia Maternal Aunt   . Diabetes Mother    Social History  Substance Use Topics  . Smoking status: Current Every Day Smoker    Packs/day: 0.25    Types: Cigarettes  . Smokeless tobacco: Never Used     Comment: smokes marijuana occasionally  . Alcohol use 0.0 oz/week     Comment: Occ     Review of Systems  Constitutional: Negative for fever.  HENT: Negative for congestion.   Eyes: Positive for photophobia, pain and redness. Negative for itching.  Respiratory: Negative for cough.   Neurological: Negative for weakness and headaches.    Allergies  Review of patient's allergies indicates no known allergies.  Home Medications   Prior to Admission medications   Medication Sig Start Date End Date Taking? Authorizing Provider  levETIRAcetam (KEPPRA) 750 MG tablet Take 1 tablet (750 mg total) by mouth 2 (two) times daily. 11/03/14  Yes Cameron Sprang, MD  gentamicin (GARAMYCIN) 0.3 % ophthalmic solution Place 2 drops into the right eye every 4 (four) hours. 09/21/15 09/26/15  Katy Apo, NP   Meds Ordered and Administered this Visit    Medications - No data to display  BP 100/77 (BP Location: Right Arm)   Pulse 66   Temp 98.7 F (37.1 C) (Oral)   Resp 16   SpO2 99%  No data found.   Physical Exam  Constitutional: He is oriented to person, place, and time. He appears well-developed and well-nourished. No distress.  HENT:  Head: Normocephalic and atraumatic.  Nose: Nose normal.  Mouth/Throat: Oropharynx is clear and moist.  Eyes: EOM and lids are normal. Pupils are equal, round, and reactive to light. Right eye exhibits no discharge, no exudate and no hordeolum. No foreign body present in the right eye. Left eye exhibits no discharge, no exudate and no hordeolum. No foreign body present in the left eye. Right conjunctiva is not injected.  Slit lamp exam:      The right eye shows corneal abrasion.    Small corneal abrasian at 9 o'clock and just above iris at 12 o;clock. No foreign bodies seen. Clear discharge.  Tender along right upper eyelid- no swelling.   Neurological: He is alert and oriented to person, place, and time.  Skin: Skin is warm and dry.  Psychiatric: He has a normal mood and affect. His behavior is normal. Judgment and thought content normal.    Urgent Care Course   Clinical Course    Procedures (including critical care time) Applied 2-3 drops of Tetracaine to right eye and used fluorescein dye. 2 small corneal abrasions  noted. Flushed eye with normal saline.   Labs Review Labs Reviewed - No data to display  Imaging Review No results found.   Visual Acuity Review  Right Eye Distance: 20/50 Left Eye Distance: 20/25 Bilateral Distance: 20/25  Right Eye Near:   Left Eye Near:    Bilateral Near:         MDM   1. Corneal abrasion, right, initial encounter    Discussed usual progression and healing of corneal abrasion. Recommend Gentamicin eye drops every 4 hours to prevent infection. May continue to wear eye patch for the next 48 hours for comfort. Recommend follow-up with his  primary care provider or an eye doctor in 48 to 72 hours if not improving.      Katy Apo, NP 09/22/15 1101

## 2015-09-21 NOTE — Discharge Instructions (Signed)
Recommend start Gentamicin eye drops every 4 hours as directed. May wear eye patch today for comfort. Follow-up with an eye doctor in 2 to 3 days if not improving.

## 2015-10-03 ENCOUNTER — Ambulatory Visit (HOSPITAL_COMMUNITY)
Admission: EM | Admit: 2015-10-03 | Discharge: 2015-10-03 | Disposition: A | Discharge status: Home / Self Care | Payer: No Typology Code available for payment source | Attending: Nurse Practitioner | Admitting: Nurse Practitioner

## 2015-10-03 ENCOUNTER — Encounter (HOSPITAL_COMMUNITY): Payer: Self-pay | Admitting: Emergency Medicine

## 2015-10-03 DIAGNOSIS — Z202 Contact with and (suspected) exposure to infections with a predominantly sexual mode of transmission: Secondary | ICD-10-CM | POA: Diagnosis not present

## 2015-10-03 DIAGNOSIS — Z832 Family history of diseases of the blood and blood-forming organs and certain disorders involving the immune mechanism: Secondary | ICD-10-CM | POA: Diagnosis not present

## 2015-10-03 DIAGNOSIS — Z833 Family history of diabetes mellitus: Secondary | ICD-10-CM | POA: Diagnosis not present

## 2015-10-03 DIAGNOSIS — F17210 Nicotine dependence, cigarettes, uncomplicated: Secondary | ICD-10-CM | POA: Insufficient documentation

## 2015-10-03 LAB — POCT URINALYSIS DIP (DEVICE)
Bilirubin Urine: NEGATIVE
GLUCOSE, UA: NEGATIVE mg/dL
Hgb urine dipstick: NEGATIVE
KETONES UR: NEGATIVE mg/dL
LEUKOCYTES UA: NEGATIVE
Nitrite: NEGATIVE
PROTEIN: NEGATIVE mg/dL
Specific Gravity, Urine: 1.02 (ref 1.005–1.030)
Urobilinogen, UA: 0.2 mg/dL (ref 0.0–1.0)
pH: 6 (ref 5.0–8.0)

## 2015-10-03 MED ORDER — CEFTRIAXONE SODIUM 250 MG IJ SOLR
INTRAMUSCULAR | Status: AC
Start: 2015-10-03 — End: 2015-10-03
  Filled 2015-10-03: qty 250

## 2015-10-03 MED ORDER — CEFTRIAXONE SODIUM 250 MG IJ SOLR
250.0000 mg | Freq: Once | INTRAMUSCULAR | Status: AC
  Administered 2015-10-03: 250 mg via INTRAMUSCULAR

## 2015-10-03 MED ORDER — LIDOCAINE HCL (PF) 1 % IJ SOLN
INTRAMUSCULAR | Status: AC
  Filled 2015-10-03: qty 2

## 2015-10-03 MED ORDER — AZITHROMYCIN 250 MG PO TABS
ORAL_TABLET | ORAL | Status: AC
  Filled 2015-10-03: qty 4

## 2015-10-03 MED ORDER — AZITHROMYCIN 250 MG PO TABS
1000.0000 mg | ORAL_TABLET | Freq: Once | ORAL | Status: AC
  Administered 2015-10-03: 1000 mg via ORAL

## 2015-10-03 NOTE — ED Provider Notes (Signed)
CSN: [id number]     Arrival date & time 10/03/15  1207 History   First MD Initiated Contact with Patient 10/03/15 1402     Chief Complaint  Patient presents with  . Exposure to STD   (Consider location/radiation/quality/duration/timing/severity/associated sxs/prior Treatment) Patient is a well-appearing 40 y.o male, presents today for STD screening. Patient states that his girlfriend got tested positive for herpes earlier this week. Patient would like to be check for all the STDs. He endorses consant tingling sensation at his penis started yesterday. He denies dysuria, urinary frequency, urinary urgency, flank pain, penile pain, penile discharge, penile rash or lesion, testicular pain or scrotal swelling.      Exposure to STD  Pertinent negatives include no chest pain, no abdominal pain, no headaches and no shortness of breath.    Past Medical History:  Diagnosis Date  . Seizures ([place])    History reviewed. No pertinent surgical history. Family History  Problem Relation Age of Onset  . Seizures Maternal Aunt   . Sickle cell anemia Maternal Aunt   . Diabetes Mother    Social History  Substance Use Topics  . Smoking status: Current Every Day Smoker    Packs/day: 0.25    Types: Cigarettes  . Smokeless tobacco: Never Used     Comment: smokes marijuana occasionally  . Alcohol use 0.0 oz/week     Comment: Occ     Review of Systems  Constitutional: Negative for chills, fatigue and fever.  Respiratory: Negative for cough and shortness of breath.   Cardiovascular: Negative for chest pain and palpitations.  Gastrointestinal: Negative for abdominal pain, diarrhea, nausea and vomiting.  Genitourinary: Negative for difficulty urinating, dysuria, flank pain, hematuria, penile pain, penile swelling, scrotal swelling and testicular pain.       Positive for tingling sensation in his penis   Skin: Negative for rash.  Neurological: Negative for dizziness, weakness and headaches.     Allergies  Review of patient's allergies indicates no known allergies.  Home Medications   Prior to Admission medications   Medication Sig Start Date End Date Taking? Authorizing Provider  levETIRAcetam (KEPPRA) 750 MG tablet Take 1 tablet (750 mg total) by mouth 2 (two) times daily. 11/03/14  Yes Cameron Sprang, MD   Meds Ordered and Administered this Visit   Medications  cefTRIAXone (ROCEPHIN) injection 250 mg (250 mg Intramuscular Given 10/03/15 1430)  azithromycin (ZITHROMAX) tablet 1,000 mg (1,000 mg Oral Given 10/03/15 1430)    BP 127/73 (BP Location: Right Arm)   Pulse 76   Temp 98.4 F (36.9 C) (Oral)   Resp 18   SpO2 99%  No data found.   Physical Exam  Constitutional: He appears well-developed and well-nourished.  Cardiovascular: Normal rate, regular rhythm and normal heart sounds.   Pulmonary/Chest: Effort normal and breath sounds normal.  Abdominal: Soft. Bowel sounds are normal. There is no tenderness.  Genitourinary:  Genitourinary Comments: Penis normal without lesion or rash, no testicular swelling or pain noted. No penile discharge noted  Nursing note and vitals reviewed.   Urgent Care Course   Clinical Course    Procedures (including critical care time)  Labs Review Labs Reviewed  HIV ANTIBODY (ROUTINE TESTING)  RPR  HSV 2 ANTIBODY, IGG  HSV 1 ANTIBODY, IGG  POCT URINALYSIS DIP (DEVICE)  URINE CYTOLOGY ANCILLARY ONLY    Imaging Review No results found.      MDM   1. STD exposure    Physical examination was unremarkable. UA was negative.  I don't suspect that he has any STIs, however will screen for STIs per patient request and will treat for GC and Chlamydia with Rocephin 250mg  and azithromycin 1000mg  as patient desired. Instructed to f/u with his PCP if his symptoms worsen or does not improve. All questions were answered. Discharge instruction given.    Barry Dienes, NP 10/03/15 [phone]

## 2015-10-03 NOTE — Discharge Instructions (Addendum)
Your urine was negative for UTI. You have been treated today in office for gonorrhea and Chlamydia. Your STD testings have been send off and hopefully will be back early next week; we will call you if anything comes back positive. You are welcome to call to ask for results as well.

## 2015-10-03 NOTE — ED Triage Notes (Signed)
The patient presented to the [facility] with a request of needing STD testing due to his partner testing positive for herpes. The patient denied any known symptoms at this time.

## 2015-10-04 LAB — RPR: RPR Ser Ql: NONREACTIVE

## 2015-10-04 LAB — URINE CYTOLOGY ANCILLARY ONLY
CHLAMYDIA, DNA PROBE: NEGATIVE
NEISSERIA GONORRHEA: NEGATIVE
TRICH (WINDOWPATH): NEGATIVE

## 2015-10-04 LAB — HSV 2 ANTIBODY, IGG: HSV 2 GLYCOPROTEIN G AB, IGG: 10.5 {index} — AB (ref 0.00–0.90)

## 2015-10-04 LAB — HIV ANTIBODY (ROUTINE TESTING W REFLEX): HIV Screen 4th Generation wRfx: NONREACTIVE

## 2015-10-04 LAB — HSV 1 ANTIBODY, IGG: HSV 1 Glycoprotein G Ab, IgG: 25.2 index — ABNORMAL HIGH (ref 0.00–0.90)

## 2015-10-19 ENCOUNTER — Telehealth (HOSPITAL_COMMUNITY): Payer: Self-pay | Admitting: Emergency Medicine

## 2015-10-19 NOTE — Telephone Encounter (Signed)
Called number on file.... No answer Need to see how pt is doing and to give lab results from recent visit on 8/27 Also let pt know labs can be obtained from [place]

## 2015-10-19 NOTE — Telephone Encounter (Signed)
-----   Message from Sherlene Shams, MD sent at 10/06/2015  4:14 PM EDT ----- Clinical staff, please let patient know that test for herpes virus was positive.   If there are sores on penis now (tingling was reported at [facility] visit 10/03/15), might benefit from famcyclovir 250mg  tid x 10d #30 no refills.   If no sores, no treatment is needed at this time.  Recheck or followup with PCP/Valerie Feliciana Rossetti for further evaluation as needed.  LM

## 2015-10-20 NOTE — Telephone Encounter (Signed)
Called number on file [phone] but no answer... Mailed letter as 3rd attempt.  Need to see how pt is doing and to give lab results from recent visit on 8/27 Also let pt know labs can be obtained from [place]

## 2016-10-08 IMAGING — DX DG CHEST 2V
2 series · 2 of 2 positions shown · non-contrast
Comparison: 08/12/2003

CLINICAL DATA: Chest abdominal pain.  Fatigue.

EXAM:
CHEST  2 VIEW

[w chest pa]
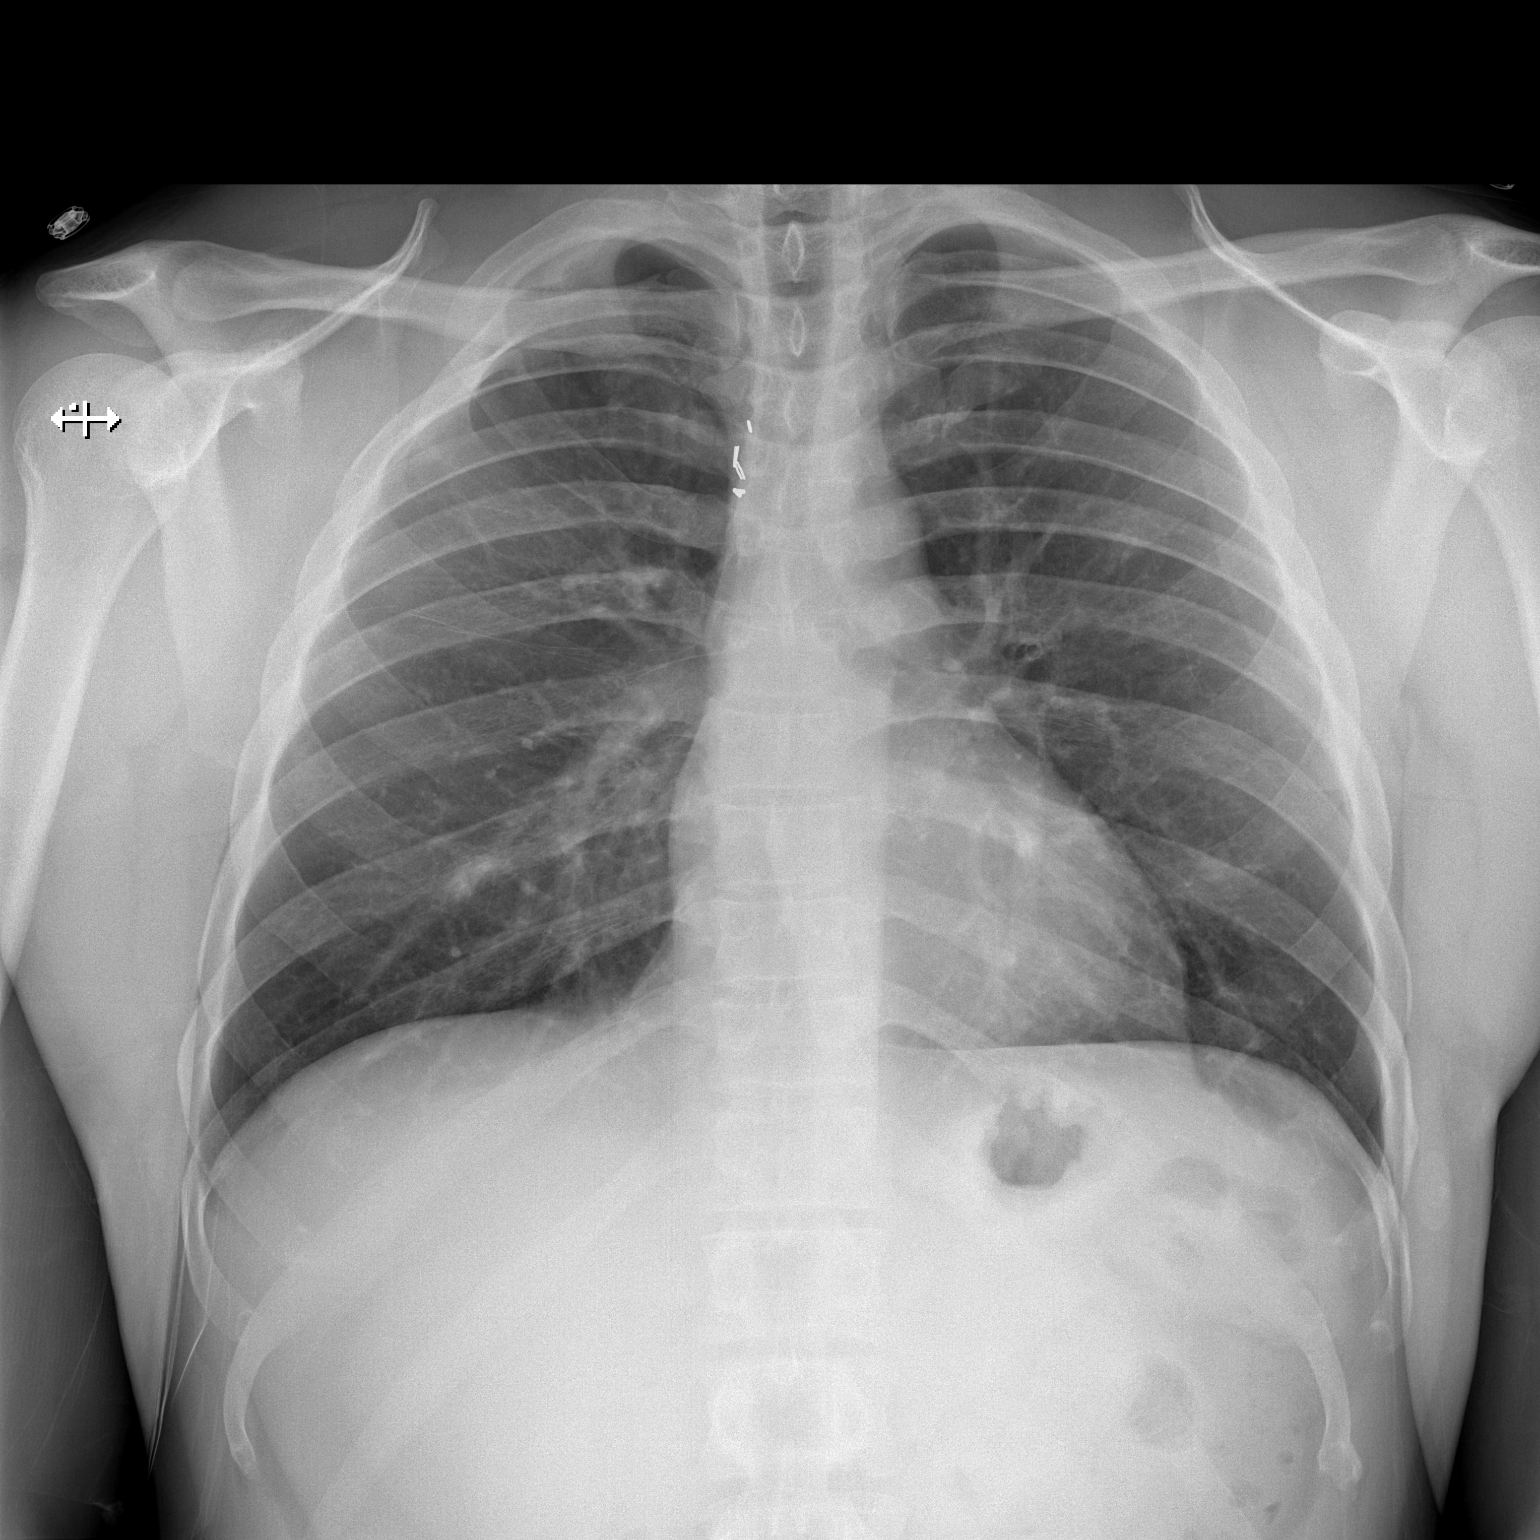

[w chest lat]
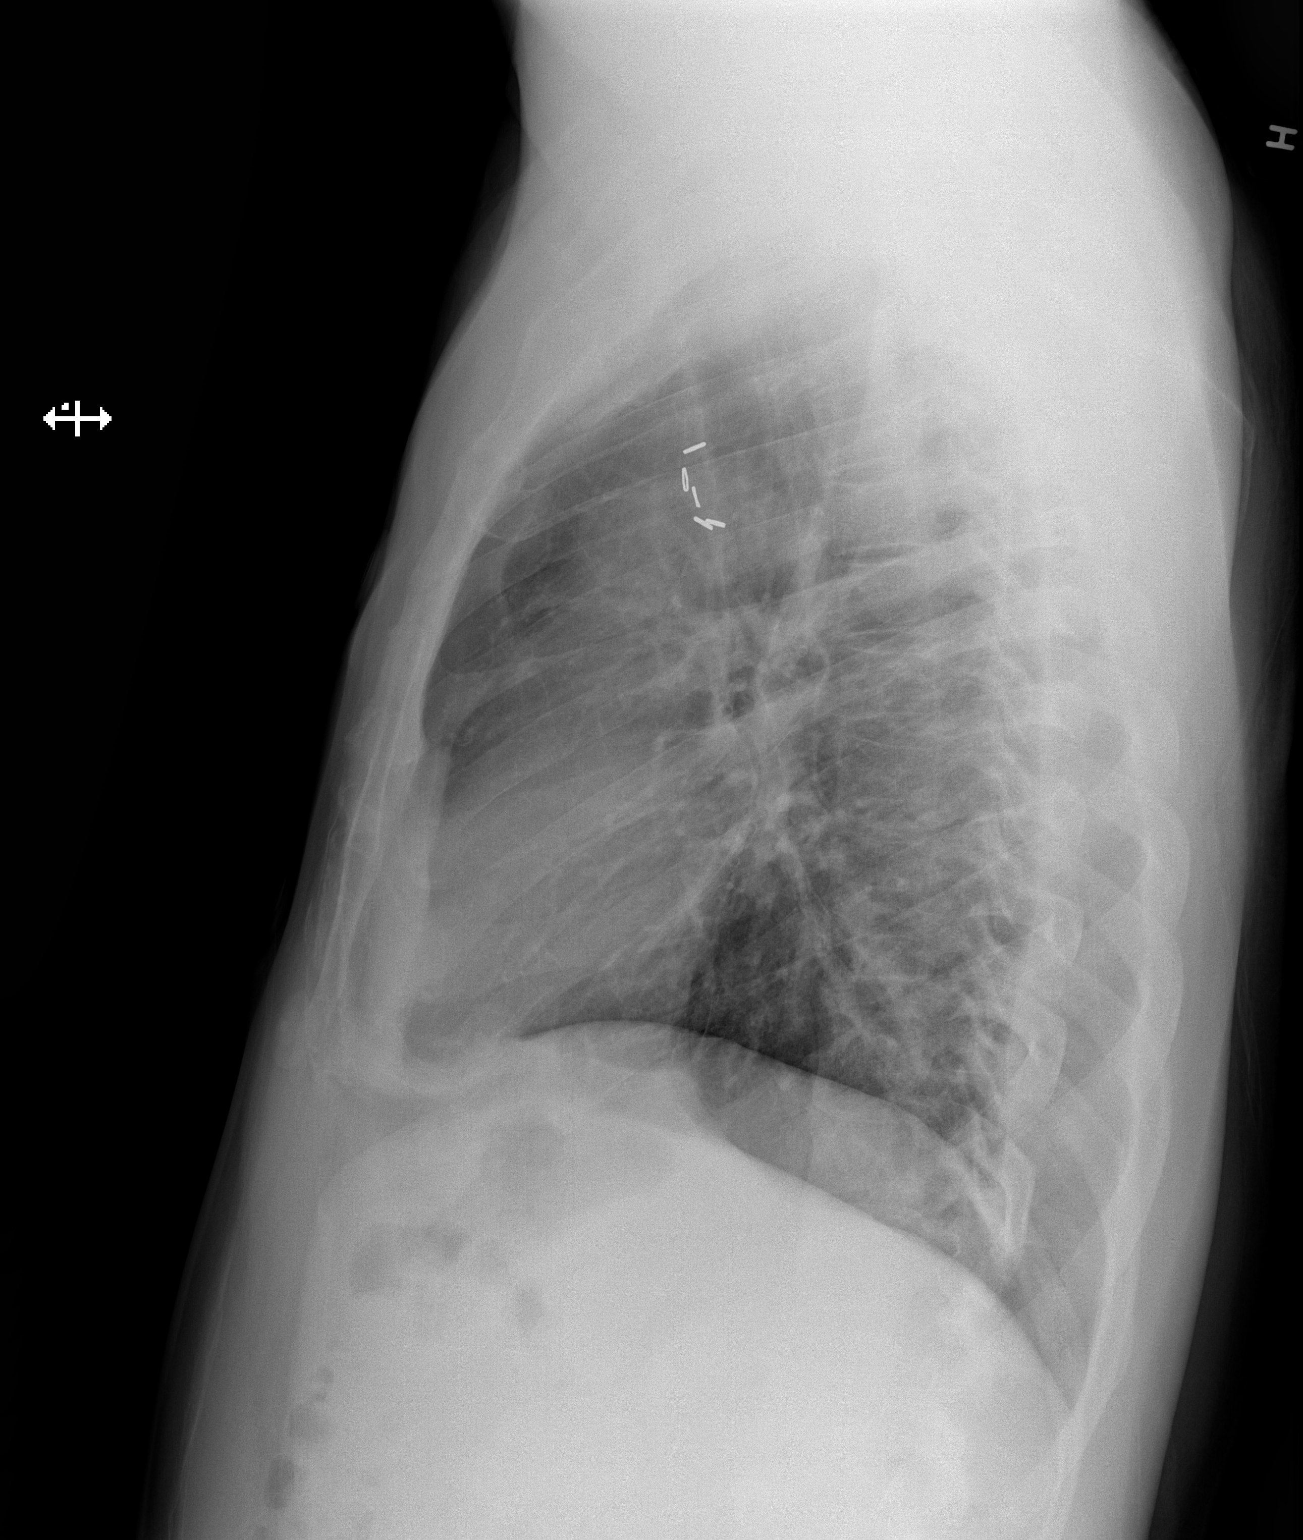

[2 of 2 positions shown; findings below may reference images not displayed]

FINDINGS: The cardiomediastinal silhouette is within normal limits. Surgical
clips are again seen in the right peritracheal region. The lungs are
well inflated without evidence of airspace consolidation, edema,
pleural effusion, or pneumothorax. Small nodular density projecting
over the left lower lung on the PA image is unchanged from the prior
study and may represent a nipple shadow or stable (and benign)
pulmonary nodule. No acute osseous abnormality is identified.
IMPRESSION: No active cardiopulmonary disease.

## 2016-10-08 IMAGING — CT CT ABD-PELV W/ CM
2 of 5 series · 16 of 46 positions shown, 18 images · IV contrast (iopamidol)
Comparison: None.

CLINICAL DATA: Constipation and loss of appetite for 5 days.
Abdominal pain worse in the right lower quadrant.

EXAM:
CT ABDOMEN AND PELVIS WITH CONTRAST
TECHNIQUE: Multidetector CT imaging of the abdomen and pelvis was performed
using the standard protocol following bolus administration of
intravenous contrast.
CONTRAST:  100mL FVII4T-4CC IOPAMIDOL (FVII4T-4CC) INJECTION 61%

[Series 2: a/p w/ 5mm · axial · 0.93mm/px · z∈[-423,-23]mm · 13 of 92 slices shown, 15 images]
[im 6/92  soft-tissue]
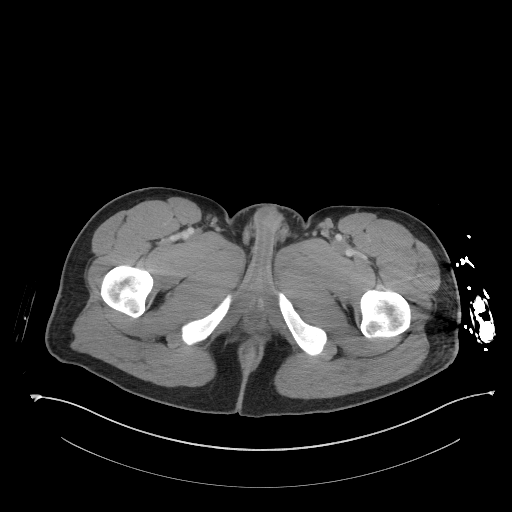
[im 6/92  bone]
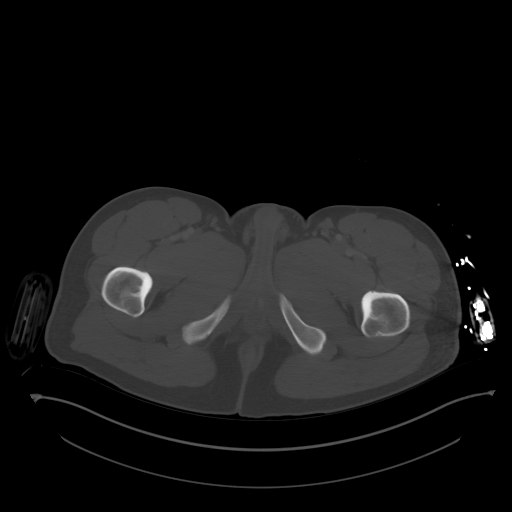
[im 11/92  soft-tissue]
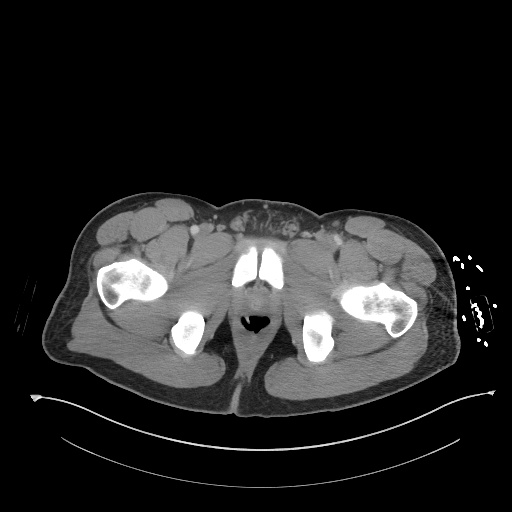
[im 21/92  soft-tissue]
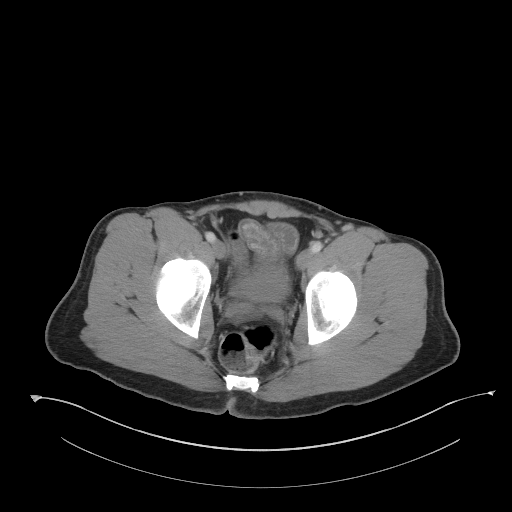
[im 26/92  soft-tissue]
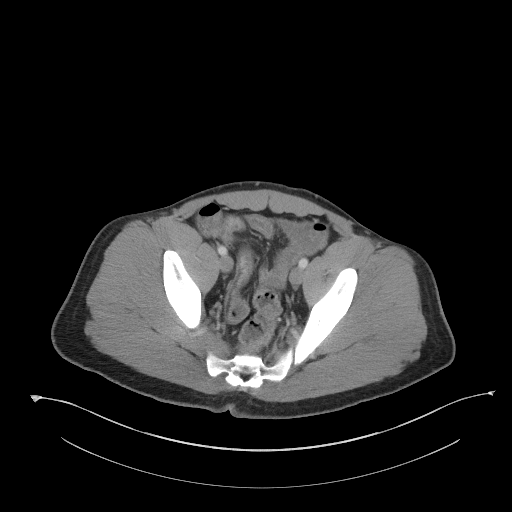
[im 31/92  soft-tissue]
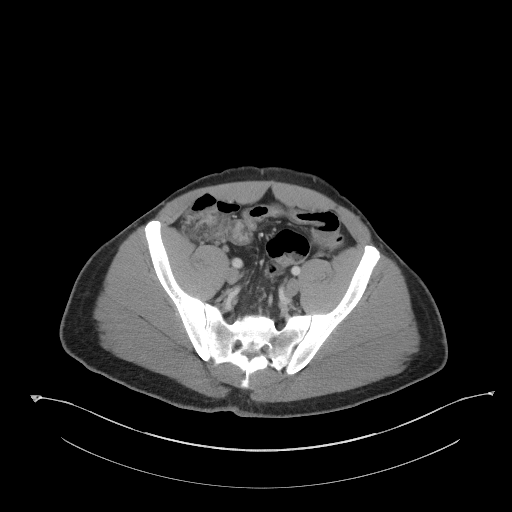
[im 41/92  soft-tissue]
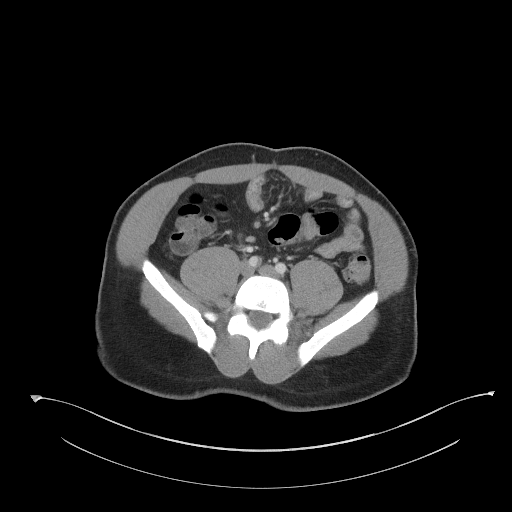
[im 46/92  soft-tissue]
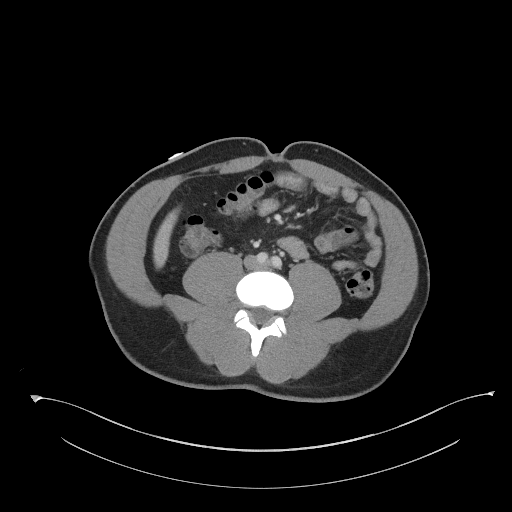
[im 51/92  soft-tissue]
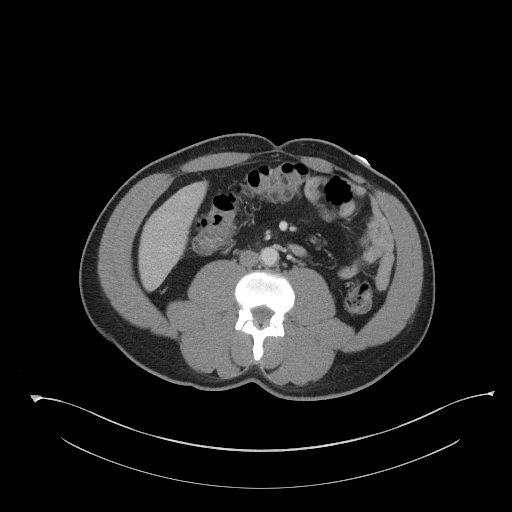
[im 61/92  soft-tissue]
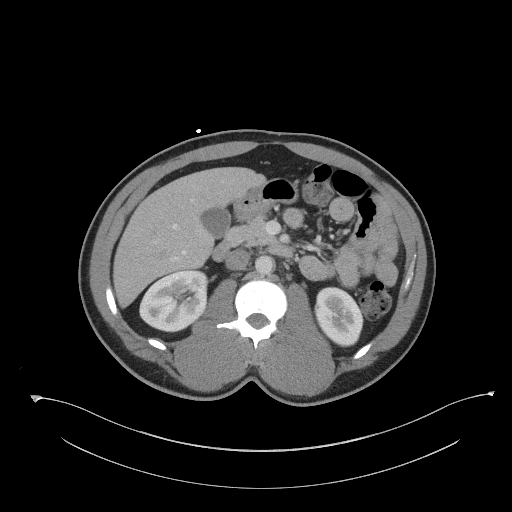
[im 61/92  bone]
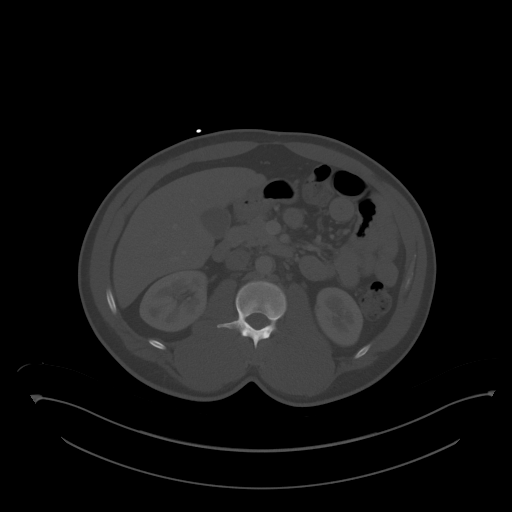
[im 66/92  soft-tissue]
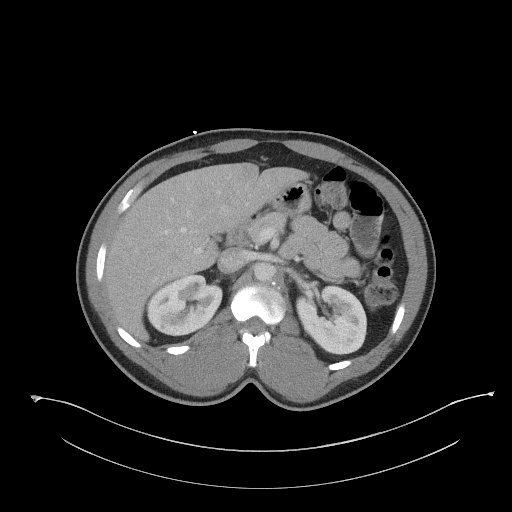
[im 71/92  soft-tissue]
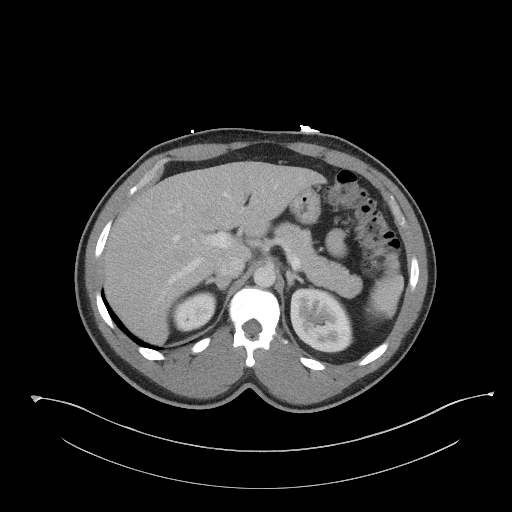
[im 81/92  soft-tissue]
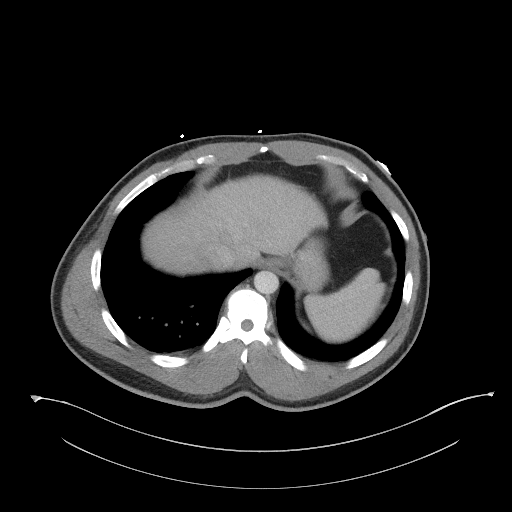
[im 86/92  soft-tissue]
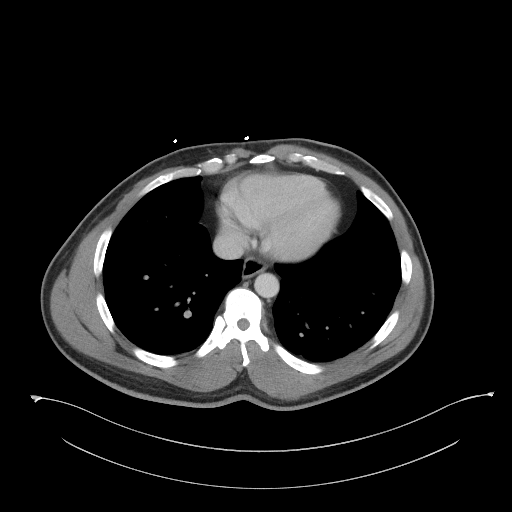

[Series 5: a/p w/ cor · coronal · 0.75mm/px · 3 of 141 slices shown]
[im 47/141  soft-tissue]
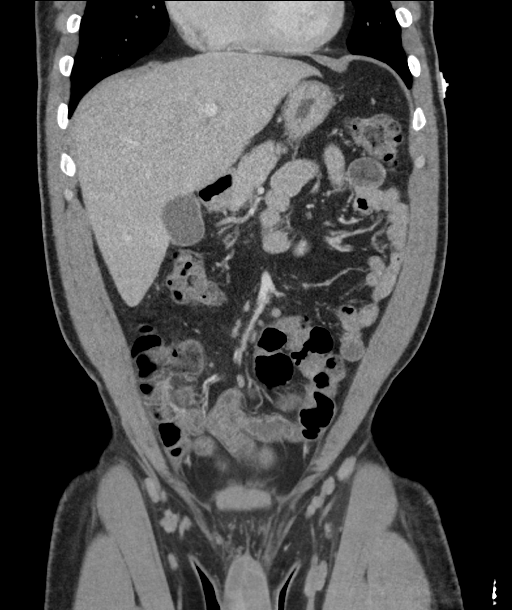
[im 63/141  soft-tissue]
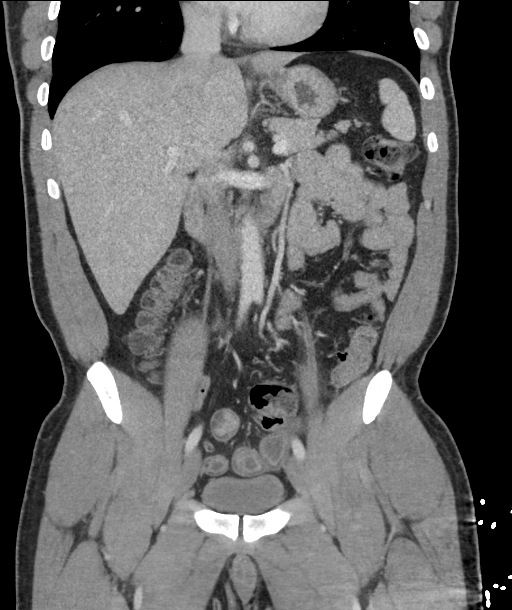
[im 78/141  soft-tissue]
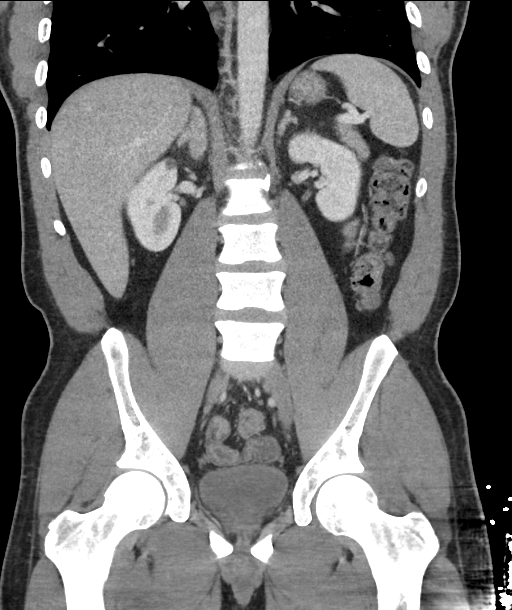

[16 of 46 positions shown; findings below may reference images not displayed]

FINDINGS: Lower chest and abdominal wall:  No contributory findings.

Hepatobiliary: No focal liver abnormality.No evidence of biliary
obstruction or stone.

Pancreas: Unremarkable.

Spleen: Unremarkable.

Adrenals/Urinary Tract: Mild thickening of the right adrenal body
without discrete nodule. Presumed sub cm bilateral renal cortical
cysts. Otherwise symmetric renal enhancement. No hydronephrosis or
stone. Unremarkable bladder.

Reproductive:No pathologic findings.

Stomach/Bowel: There is hypervascular appearance to the mucosa of
the ileum with borderline wall thickening. The associated mesenteric
fat is mildly hazy and there are prominent mesenteric lymph nodes.
No ulceration or stricture is seen. No skip areas of inflammation.
No appendicitis.

Vascular/Lymphatic: Extensive aortic atherosclerosis with
predominately noncalcified plaque. No acute vascular abnormality.
Mild reactive appearing enlargement of mesenteric lymph nodes.

Peritoneal: Trace pelvic fluid which is considered reactive.

Musculoskeletal: No sacroiliitis or spondyloarthropathy .
IMPRESSION: 1. Suspect mild enteritis. No appendicitis.
2. Age advanced aortic atherosclerosis.

## 2017-01-02 ENCOUNTER — Emergency Department (HOSPITAL_COMMUNITY)
Admission: EM | Admit: 2017-01-02 | Discharge: 2017-01-02 | Disposition: A | Discharge status: Home / Self Care | Payer: No Typology Code available for payment source | Attending: Emergency Medicine | Admitting: Emergency Medicine

## 2017-01-02 ENCOUNTER — Other Ambulatory Visit: Payer: Self-pay

## 2017-01-02 ENCOUNTER — Encounter (HOSPITAL_COMMUNITY): Payer: Self-pay | Admitting: Emergency Medicine

## 2017-01-02 DIAGNOSIS — G40009 Localization-related (focal) (partial) idiopathic epilepsy and epileptic syndromes with seizures of localized onset, not intractable, without status epilepticus: Secondary | ICD-10-CM | POA: Insufficient documentation

## 2017-01-02 DIAGNOSIS — Z760 Encounter for issue of repeat prescription: Secondary | ICD-10-CM

## 2017-01-02 DIAGNOSIS — F17210 Nicotine dependence, cigarettes, uncomplicated: Secondary | ICD-10-CM | POA: Insufficient documentation

## 2017-01-02 DIAGNOSIS — F419 Anxiety disorder, unspecified: Secondary | ICD-10-CM | POA: Insufficient documentation

## 2017-01-02 DIAGNOSIS — Z79899 Other long term (current) drug therapy: Secondary | ICD-10-CM | POA: Insufficient documentation

## 2017-01-02 MED ORDER — LEVETIRACETAM 750 MG PO TABS
750.0000 mg | ORAL_TABLET | Freq: Once | ORAL | Status: AC
  Administered 2017-01-02: 750 mg via ORAL
  Filled 2017-01-02: qty 1

## 2017-01-02 MED ORDER — LEVETIRACETAM 750 MG PO TABS: 750.0000 mg | ORAL_TABLET | Freq: Two times a day (BID) | ORAL | 2 refills | Status: DC

## 2017-01-02 MED FILL — levETIRAcetam 750 MG TABS: 750 | 30 days supply | Qty: 60 | Fill #0

## 2017-01-02 NOTE — Discharge Instructions (Signed)
Return if any problems.

## 2017-01-02 NOTE — ED Triage Notes (Addendum)
Needs keppra refill has been out x 2 days states no sz he states and needs PCP

## 2017-01-02 NOTE — ED Notes (Signed)
Pt reports he has been out of his Keppra.

## 2017-01-02 NOTE — ED Provider Notes (Signed)
[facility] EMERGENCY DEPARTMENT Provider Note   CSN: [id number] Arrival date & time: 01/02/17  1144     History   Chief Complaint Chief Complaint  Patient presents with  . Medication Refill    HPI Brian Ward is a 41 y.o. male.  The history is provided by the patient. No language interpreter was used.  Medication Refill  Medications/supplies requested:  [place] Reason for request:  Medications ran out Medications taken before: yes - see home medications   Patient has complete original prescription information: no   Pt moved here from [place].  Medication lost.  Pt needs primary MD in [place]  Past Medical History:  Diagnosis Date  . Seizures [facility])     Patient Active Problem List   Diagnosis Date Noted  . Localization-related idiopathic epilepsy and epileptic syndromes with seizures of localized onset, not intractable, without status epilepticus ([place]) 11/03/2014  . Anxiety state 11/03/2014    No past surgical history on file.     Home Medications    Prior to Admission medications   Medication Sig Start Date End Date Taking? Authorizing Provider  levETIRAcetam (KEPPRA) 750 MG tablet Take 1 tablet (750 mg total) by mouth 2 (two) times daily. 11/03/14   Cameron Sprang, MD    Family History Family History  Problem Relation Age of Onset  . Seizures Maternal Aunt   . Sickle cell anemia Maternal Aunt   . Diabetes Mother     Social History Social History   Tobacco Use  . Smoking status: Current Every Day Smoker    Packs/day: 0.25    Types: Cigarettes  . Smokeless tobacco: Never Used  . Tobacco comment: smokes marijuana occasionally  Substance Use Topics  . Alcohol use: Yes    Alcohol/week: 0.0 oz    Comment: Occ   . Drug use: No     Allergies   Patient has no known allergies.   Review of Systems Review of Systems  All other systems reviewed and are negative.    Physical Exam Updated Vital Signs BP 138/88 (BP Location:  Right Arm)   Pulse 77   Temp 98.7 F (37.1 C) (Oral)   Resp 16   SpO2 99%   Physical Exam  Constitutional: He is oriented to person, place, and time. He appears well-developed and well-nourished.  HENT:  Head: Normocephalic.  Eyes: EOM are normal.  Neck: Normal range of motion.  Pulmonary/Chest: Effort normal.  Abdominal: He exhibits no distension.  Musculoskeletal: Normal range of motion.  Neurological: He is alert and oriented to person, place, and time.  Psychiatric: He has a normal mood and affect.  Nursing note and vitals reviewed.    ED Treatments / Results  Labs (all labs ordered are listed, but only abnormal results are displayed) Labs Reviewed - No data to display  EKG  EKG Interpretation None       Radiology No results found.  Procedures Procedures (including critical care time)  Medications Ordered in ED Medications  levETIRAcetam (KEPPRA) tablet 750 mg (not administered)     Initial Impression / Assessment and Plan / ED Course  I have reviewed the triage vital signs and the nursing notes.  Pertinent labs & imaging results that were available during my care of the patient were reviewed by me and considered in my medical decision making (see chart for details).     No recent seizures.   Final Clinical Impressions(s) / ED Diagnoses   Final diagnoses:  Medication refill  ED Discharge Orders        Ordered    levETIRAcetam (KEPPRA) 750 MG tablet  2 times daily     01/02/17 1347    An After Visit Summary was printed and given to the patient.    Fransico Meadow, [place] 01/02/17 1348    Fredia Sorrow, MD 01/12/17 1622

## 2017-01-15 ENCOUNTER — Ambulatory Visit: Payer: Self-pay | Admitting: Nurse Practitioner

## 2017-01-26 ENCOUNTER — Ambulatory Visit: Payer: Self-pay | Admitting: Nurse Practitioner

## 2017-02-13 ENCOUNTER — Ambulatory Visit: Payer: Self-pay | Admitting: Licensed Clinical Social Worker

## 2017-02-13 ENCOUNTER — Ambulatory Visit: Payer: Self-pay | Attending: Nurse Practitioner | Admitting: Nurse Practitioner

## 2017-02-13 ENCOUNTER — Encounter: Payer: Self-pay | Admitting: Nurse Practitioner

## 2017-02-13 VITALS — BP 106/67 | HR 72 | Temp 98.3°F | Ht 68.0 in | Wt 192.0 lb

## 2017-02-13 DIAGNOSIS — G40909 Epilepsy, unspecified, not intractable, without status epilepticus: Secondary | ICD-10-CM | POA: Insufficient documentation

## 2017-02-13 DIAGNOSIS — Z833 Family history of diabetes mellitus: Secondary | ICD-10-CM | POA: Insufficient documentation

## 2017-02-13 DIAGNOSIS — F331 Major depressive disorder, recurrent, moderate: Secondary | ICD-10-CM

## 2017-02-13 DIAGNOSIS — Z832 Family history of diseases of the blood and blood-forming organs and certain disorders involving the immune mechanism: Secondary | ICD-10-CM | POA: Insufficient documentation

## 2017-02-13 DIAGNOSIS — Z87891 Personal history of nicotine dependence: Secondary | ICD-10-CM | POA: Insufficient documentation

## 2017-02-13 DIAGNOSIS — H6123 Impacted cerumen, bilateral: Secondary | ICD-10-CM | POA: Insufficient documentation

## 2017-02-13 DIAGNOSIS — F321 Major depressive disorder, single episode, moderate: Secondary | ICD-10-CM

## 2017-02-13 DIAGNOSIS — Z820 Family history of epilepsy and other diseases of the nervous system: Secondary | ICD-10-CM | POA: Insufficient documentation

## 2017-02-13 DIAGNOSIS — Z79899 Other long term (current) drug therapy: Secondary | ICD-10-CM | POA: Insufficient documentation

## 2017-02-13 DIAGNOSIS — F1290 Cannabis use, unspecified, uncomplicated: Secondary | ICD-10-CM | POA: Insufficient documentation

## 2017-02-13 DIAGNOSIS — F329 Major depressive disorder, single episode, unspecified: Secondary | ICD-10-CM | POA: Insufficient documentation

## 2017-02-13 MED ORDER — CARBAMIDE PEROXIDE 6.5 % OT SOLN: 5.0000 [drp] | Freq: Two times a day (BID) | OTIC | 0 refills | Status: AC

## 2017-02-13 MED ORDER — FLUOXETINE HCL 20 MG PO TABS: 20.0000 mg | ORAL_TABLET | Freq: Every day | ORAL | 3 refills | Status: DC

## 2017-02-13 MED FILL — FLUoxetine HCL 20 MG CAPS: 20 | 30 days supply | Qty: 30 | Fill #0

## 2017-02-13 NOTE — Progress Notes (Signed)
Assessment & Plan:  Brian Ward was seen today for establish care.  Diagnoses and all orders for this visit:  Seizure disorder [facility]) -     Ambulatory referral to Neurology Continue Kepra as prescribed  Current moderate episode of major depressive disorder without prior episode ([place]) -     FLUoxetine (PROZAC) 20 MG tablet; Take 1 tablet (20 mg total) by mouth daily. Denies SI/HI  Bilateral impacted cerumen -     carbamide peroxide (DEBROX) 6.5 % OTIC solution; Place 5 drops into both ears 2 (two) times daily for 4 days. Recommended Debrox at home. If unsuccessful patient will schedule nurse visit for cerumen impaction removal.    Patient has been counseled on age-appropriate routine health concerns for screening and prevention. These are reviewed and up-to-date. Referrals have been placed accordingly. Immunizations are up-to-date or declined.    Subjective:   Chief Complaint  Patient presents with  . Establish Care    Patient is here to establish care. Patient want to talk to PCP with his concerns. Patient also would like refer to neurologist.    HPI Brian Ward 42 y.o. male presents to office today to establish care as a new patient. He has not received care from this clinic in over 2 years. He has a history of seizures.   Seizures Controlled on Kepra 750mg  BID.  He has been seizure free according to review of records since 11-2015 which was the last documented ED visit for seizure. At that time patient reportedly had not been taking Kepra as prescribed. Today he endorses medication compliance. Reports since he was diagnosed with epilepsy in 2015 he has been having difficulty working and being around people. He is sleeping 4 hours a day and smoking black and milds to help him sleep since the onset of his seizures. He has smoked marijuana in the past but reports he stopped smoking a few months ago.He is requesting a referral to neurology today.  Feels like he is thinking too much and  his brain is never resting. Endorses mood swings and feelings of sadness. He reports seeing a psychiatrist 3 years ago and at that time he was not diagnosed with depression. States "we just joked around and she told me I wasn't depressed. I wasn't going to sit and tell her all of my problems anyway". I suggested another psychiatry referral today however he seems to think his symptoms are related to his seizure disorder. I do not feel he needs to see neurology however he is adamant that I refer him. I will have our onsite social worker see him today and provide him with additional mental health resources. He is agreeable to starting prozac today.     Review of Systems  Constitutional: Negative for fever, malaise/fatigue and weight loss.  HENT: Positive for hearing loss. Negative for nosebleeds.   Eyes: Negative.  Negative for blurred vision, double vision and photophobia.  Respiratory: Negative.  Negative for cough and shortness of breath.   Cardiovascular: Negative.  Negative for chest pain, palpitations and leg swelling.  Gastrointestinal: Negative.  Negative for abdominal pain, constipation, diarrhea, heartburn, nausea and vomiting.  Musculoskeletal: Negative.  Negative for myalgias.  Neurological: Positive for seizures. Negative for dizziness, focal weakness and headaches.  Endo/Heme/Allergies: Negative for environmental allergies.  Psychiatric/Behavioral: Positive for depression. Negative for suicidal ideas. The patient is nervous/anxious and has insomnia.     Past Medical History:  Diagnosis Date  . Seizures ([place])     History reviewed. No pertinent  surgical history.  Family History  Problem Relation Age of Onset  . Seizures Maternal Aunt   . Sickle cell anemia Maternal Aunt   . Diabetes Mother     Social History Reviewed with no changes to be made today.   Outpatient Medications Prior to Visit  Medication Sig Dispense Refill  . levETIRAcetam (KEPPRA) 750 MG tablet Take 1  tablet (750 mg total) by mouth 2 (two) times daily. 60 tablet 2   No facility-administered medications prior to visit.     No Known Allergies     Objective:    BP 106/67 (BP Location: Left Arm, Patient Position: Sitting, Cuff Size: Normal)   Pulse 72   Temp 98.3 F (36.8 C) (Oral)   Ht 5\' 8"  (1.727 m)   Wt 192 lb (87.1 kg)   SpO2 99%   BMI 29.19 kg/m  Wt Readings from Last 3 Encounters:  02/13/17 192 lb (87.1 kg)  08/25/15 192 lb (87.1 kg)  06/23/15 182 lb (82.6 kg)    Physical Exam  Constitutional: He is oriented to person, place, and time. He appears well-developed and well-nourished. He is cooperative.  HENT:  Head: Normocephalic and atraumatic.  Right Ear: Decreased hearing is noted.  Left Ear: Decreased hearing is noted.  B/L cerumen impaction  Eyes: EOM are normal.  Neck: Normal range of motion.  Cardiovascular: Normal rate, regular rhythm, normal heart sounds and intact distal pulses. Exam reveals no gallop and no friction rub.  No murmur heard. Pulmonary/Chest: Effort normal and breath sounds normal. No tachypnea. No respiratory distress. He has no decreased breath sounds. He has no wheezes. He has no rhonchi. He has no rales. He exhibits no tenderness.  Abdominal: Soft. Bowel sounds are normal.  Musculoskeletal: Normal range of motion. He exhibits no edema.  Neurological: He is alert and oriented to person, place, and time. Coordination normal.  Skin: Skin is warm and dry.  Psychiatric: His speech is normal and behavior is normal. Judgment and thought content normal. His affect is labile. Cognition and memory are normal. He exhibits a depressed mood. He expresses no homicidal and no suicidal ideation. He expresses no suicidal plans and no homicidal plans.  Nursing note and vitals reviewed.      Patient has been counseled extensively about nutrition and exercise as well as the importance of adherence with medications and regular follow-up. The patient was given  clear instructions to go to ER or return to medical center if symptoms don't improve, worsen or new problems develop. The patient verbalized understanding.   Follow-up: Return in about 3 weeks (around 03/06/2017) for Needs appointment with financial representative., Depression and Anxiety.   Gildardo Pounds, FNP-BC [facility] [place], [place]   02/13/2017, 4:11 PM

## 2017-02-13 NOTE — BH Specialist Note (Signed)
Integrated Behavioral Health Initial Visit  MRN: [id number] Name: Brian Ward  Number of [place] Clinician visits:: 1/6 Session Start time: 2:25 PM  Session End time: 2:55 PM Total time: 30 minutes  Type of Service: [place] Interpretor:No. Interpretor Name and Language: N/A   Warm Hand Off Completed.       SUBJECTIVE: Brian Ward is a 42 y.o. male accompanied by self Patient was referred by NP Raul Del for depression and anxiety. Patient reports the following symptoms/concerns: overwhelming feelings of sadness and worry, racing thoughts, decreased sleep, low motivation, decreased concentration, and irritability Duration of problem: Ongoing approx 3 years; Severity of problem: moderate  OBJECTIVE: Mood: Depressed and Affect: Depressed Risk of harm to self or others: No plan to harm self or others  LIFE CONTEXT: Family and Social: Pt resides with his sister and mother. He has 5 adult children and one grandchild School/Work: Pt reports plans to enroll into school and obtain employment. Family provides financial support Self-Care: Pt enjoys spending time with his grandchild. He smokes Black and Milds (4 daily) and has a hx of marijuana use Life Changes: Pt reports frustration with management of epilepsy and how it prevents him from obtaining work resulting in financial strain. His mother's health is declining and he wishes that he could assist financially.  GOALS ADDRESSED: Patient will: 1. Reduce symptoms of: anxiety and depression 2. Increase knowledge and/or ability of: coping skills and stress reduction  3. Demonstrate ability to: Increase healthy adjustment to current life circumstances and Increase adequate support systems for patient/family  INTERVENTIONS: Interventions utilized: Solution-Focused Strategies, Supportive Counseling, Psychoeducation and/or Health Education and Link to [employer]  Standardized  Assessments completed: GAD-7 and PHQ 2&9  ASSESSMENT: Patient currently experiencing depression and anxiety triggered by ongoing management of his medical condition, financial strain, and a decline in his mother's health. He reports overwhelming feelings of sadness and worry, racing thoughts, decreased sleep, low motivation, decreased concentration, and irritability. Pt receives emotional and financial support from family; however, would like to become more independent.   Patient may benefit from psychoeducation and psychotherapy. He has agreed to participate in medication management through PCP. LCSWA educated pt on the correlation between one's physical and mental health. Pt's feelings of frustration were validated and encouragement was provided. LCSWA assisted pt in establishing SMART goals and provided information on community housing and labor resources. Pt is not interested in psychotherapy.   PLAN: 1. Follow up with behavioral health clinician on : Pt was encouraged to contact LCSWA if symptoms worsen or fail to improve to schedule behavioral appointments at [facility]. 2. Behavioral recommendations: LCSWA recommends that pt apply healthy coping skills discussed, comply with medication management, and utilize provided resources. Pt is encouraged to schedule follow up appointment with LCSWA 3. Referral(s): [facility]) 4. "From scale of 1-10, how likely are you to follow plan?": 8/10  Rebekah Chesterfield, LCSW 02/16/16 9:16 AM

## 2017-02-13 NOTE — Patient Instructions (Addendum)
Fluoxetine capsules or tablets (Depression/Mood Disorders) What is this medicine? FLUOXETINE (floo OX e teen) belongs to a class of drugs known as selective serotonin reuptake inhibitors (SSRIs). It helps to treat mood problems such as depression, obsessive compulsive disorder, and panic attacks. It can also treat certain eating disorders. This medicine may be used for other purposes; ask your health care provider or pharmacist if you have questions. COMMON BRAND NAME(S): Prozac What should I tell my health care provider before I take this medicine? They need to know if you have any of these conditions: -bipolar disorder or a family history of bipolar disorder -bleeding disorders -glaucoma -heart disease -liver disease -low levels of sodium in the blood -seizures -suicidal thoughts, plans, or attempt; a previous suicide attempt by you or a family member -take MAOIs like Carbex, Eldepryl, Marplan, Nardil, and Parnate -take medicines that treat or prevent blood clots -thyroid disease -an unusual or allergic reaction to fluoxetine, other medicines, foods, dyes, or preservatives -pregnant or trying to get pregnant -breast-feeding How should I use this medicine? Take this medicine by mouth with a glass of water. Follow the directions on the prescription label. You can take this medicine with or without food. Take your medicine at regular intervals. Do not take it more often than directed. Do not stop taking this medicine suddenly except upon the advice of your doctor. Stopping this medicine too quickly may cause serious side effects or your condition may worsen. A special MedGuide will be given to you by the pharmacist with each prescription and refill. Be sure to read this information carefully each time. Talk to your pediatrician regarding the use of this medicine in children. While this drug may be prescribed for children as young as 7 years for selected conditions, precautions do  apply. Overdosage: If you think you have taken too much of this medicine contact a poison control center or emergency room at once. NOTE: This medicine is only for you. Do not share this medicine with others. What if I miss a dose? If you miss a dose, skip the missed dose and go back to your regular dosing schedule. Do not take double or extra doses. What may interact with this medicine? Do not take this medicine with any of the following medications: -other medicines containing fluoxetine, like Sarafem or Symbyax -cisapride -linezolid -MAOIs like Carbex, Eldepryl, Marplan, Nardil, and Parnate -methylene blue (injected into a vein) -pimozide -thioridazine This medicine may also interact with the following medications: -alcohol -amphetamines -aspirin and aspirin-like medicines -carbamazepine -certain medicines for depression, anxiety, or psychotic disturbances -certain medicines for migraine headaches like almotriptan, eletriptan, frovatriptan, naratriptan, rizatriptan, sumatriptan, zolmitriptan -digoxin -diuretics -fentanyl -flecainide -furazolidone -isoniazid -lithium -medicines for sleep -medicines that treat or prevent blood clots like warfarin, enoxaparin, and dalteparin -NSAIDs, medicines for pain and inflammation, like ibuprofen or naproxen -phenytoin -procarbazine -propafenone -rasagiline -ritonavir -supplements like St. John's wort, kava kava, valerian -tramadol -tryptophan -vinblastine This list may not describe all possible interactions. Give your health care provider a list of all the medicines, herbs, non-prescription drugs, or dietary supplements you use. Also tell them if you smoke, drink alcohol, or use illegal drugs. Some items may interact with your medicine. What should I watch for while using this medicine? Tell your doctor if your symptoms do not get better or if they get worse. Visit your doctor or health care professional for regular checks on your  progress. Because it may take several weeks to see the full effects of this medicine, it   is important to continue your treatment as prescribed by your doctor. Patients and their families should watch out for new or worsening thoughts of suicide or depression. Also watch out for sudden changes in feelings such as feeling anxious, agitated, panicky, irritable, hostile, aggressive, impulsive, severely restless, overly excited and hyperactive, or not being able to sleep. If this happens, especially at the beginning of treatment or after a change in dose, call your health care professional. Dennis Bast may get drowsy or dizzy. Do not drive, use machinery, or do anything that needs mental alertness until you know how this medicine affects you. Do not stand or sit up quickly, especially if you are an older patient. This reduces the risk of dizzy or fainting spells. Alcohol may interfere with the effect of this medicine. Avoid alcoholic drinks. Your mouth may get dry. Chewing sugarless gum or sucking hard candy, and drinking plenty of water may help. Contact your doctor if the problem does not go away or is severe. This medicine may affect blood sugar levels. If you have diabetes, check with your doctor or health care professional before you change your diet or the dose of your diabetic medicine. What side effects may I notice from receiving this medicine? Side effects that you should report to your doctor or health care professional as soon as possible: -allergic reactions like skin rash, itching or hives, swelling of the face, lips, or tongue -anxious -black, tarry stools -breathing problems -changes in vision -confusion -elevated mood, decreased need for sleep, racing thoughts, impulsive behavior -eye pain -fast, irregular heartbeat -feeling faint or lightheaded, falls -feeling agitated, angry, or irritable -hallucination, loss of contact with reality -loss of balance or coordination -loss of memory -painful  or prolonged erections -restlessness, pacing, inability to keep still -seizures -stiff muscles -suicidal thoughts or other mood changes -trouble sleeping -unusual bleeding or bruising -unusually weak or tired -vomiting Side effects that usually do not require medical attention (report to your doctor or health care professional if they continue or are bothersome): -change in appetite or weight -change in sex drive or performance -diarrhea -dry mouth -headache -increased sweating -nausea -tremors This list may not describe all possible side effects. Call your doctor for medical advice about side effects. You may report side effects to FDA at 1-800-FDA-1088. Where should I keep my medicine? Keep out of the reach of children. Store at room temperature between 15 and 30 degrees C (59 and 86 degrees F). Throw away any unused medicine after the expiration date. NOTE: This sheet is a summary. It may not cover all possible information. If you have questions about this medicine, talk to your doctor, pharmacist, or health care provider.  2018 Elsevier/Gold Standard (2015-06-26 15:55:27) Fluoxetine capsules or tablets (Depression/Mood Disorders) What is this medicine? FLUOXETINE (floo OX e teen) belongs to a class of drugs known as selective serotonin reuptake inhibitors (SSRIs). It helps to treat mood problems such as depression, obsessive compulsive disorder, and panic attacks. It can also treat certain eating disorders. This medicine may be used for other purposes; ask your health care provider or pharmacist if you have questions. COMMON BRAND NAME(S): Prozac What should I tell my health care provider before I take this medicine? They need to know if you have any of these conditions: -bipolar disorder or a family history of bipolar disorder -bleeding disorders -glaucoma -heart disease -liver disease -low levels of sodium in the blood -seizures -suicidal thoughts, plans, or attempt; a  previous suicide attempt by you or a family  member -take MAOIs like Carbex, Eldepryl, Marplan, Nardil, and Parnate -take medicines that treat or prevent blood clots -thyroid disease -an unusual or allergic reaction to fluoxetine, other medicines, foods, dyes, or preservatives -pregnant or trying to get pregnant -breast-feeding How should I use this medicine? Take this medicine by mouth with a glass of water. Follow the directions on the prescription label. You can take this medicine with or without food. Take your medicine at regular intervals. Do not take it more often than directed. Do not stop taking this medicine suddenly except upon the advice of your doctor. Stopping this medicine too quickly may cause serious side effects or your condition may worsen. A special MedGuide will be given to you by the pharmacist with each prescription and refill. Be sure to read this information carefully each time. Talk to your pediatrician regarding the use of this medicine in children. While this drug may be prescribed for children as young as 7 years for selected conditions, precautions do apply. Overdosage: If you think you have taken too much of this medicine contact a poison control center or emergency room at once. NOTE: This medicine is only for you. Do not share this medicine with others. What if I miss a dose? If you miss a dose, skip the missed dose and go back to your regular dosing schedule. Do not take double or extra doses. What may interact with this medicine? Do not take this medicine with any of the following medications: -other medicines containing fluoxetine, like Sarafem or Symbyax -cisapride -linezolid -MAOIs like Carbex, Eldepryl, Marplan, Nardil, and Parnate -methylene blue (injected into a vein) -pimozide -thioridazine This medicine may also interact with the following medications: -alcohol -amphetamines -aspirin and aspirin-like medicines -carbamazepine -certain medicines  for depression, anxiety, or psychotic disturbances -certain medicines for migraine headaches like almotriptan, eletriptan, frovatriptan, naratriptan, rizatriptan, sumatriptan, zolmitriptan -digoxin -diuretics -fentanyl -flecainide -furazolidone -isoniazid -lithium -medicines for sleep -medicines that treat or prevent blood clots like warfarin, enoxaparin, and dalteparin -NSAIDs, medicines for pain and inflammation, like ibuprofen or naproxen -phenytoin -procarbazine -propafenone -rasagiline -ritonavir -supplements like St. John's wort, kava kava, valerian -tramadol -tryptophan -vinblastine This list may not describe all possible interactions. Give your health care provider a list of all the medicines, herbs, non-prescription drugs, or dietary supplements you use. Also tell them if you smoke, drink alcohol, or use illegal drugs. Some items may interact with your medicine. What should I watch for while using this medicine? Tell your doctor if your symptoms do not get better or if they get worse. Visit your doctor or health care professional for regular checks on your progress. Because it may take several weeks to see the full effects of this medicine, it is important to continue your treatment as prescribed by your doctor. Patients and their families should watch out for new or worsening thoughts of suicide or depression. Also watch out for sudden changes in feelings such as feeling anxious, agitated, panicky, irritable, hostile, aggressive, impulsive, severely restless, overly excited and hyperactive, or not being able to sleep. If this happens, especially at the beginning of treatment or after a change in dose, call your health care professional. Dennis Bast may get drowsy or dizzy. Do not drive, use machinery, or do anything that needs mental alertness until you know how this medicine affects you. Do not stand or sit up quickly, especially if you are an older patient. This reduces the risk of dizzy  or fainting spells. Alcohol may interfere with the effect of this medicine.  Avoid alcoholic drinks. Your mouth may get dry. Chewing sugarless gum or sucking hard candy, and drinking plenty of water may help. Contact your doctor if the problem does not go away or is severe. This medicine may affect blood sugar levels. If you have diabetes, check with your doctor or health care professional before you change your diet or the dose of your diabetic medicine. What side effects may I notice from receiving this medicine? Side effects that you should report to your doctor or health care professional as soon as possible: -allergic reactions like skin rash, itching or hives, swelling of the face, lips, or tongue -anxious -black, tarry stools -breathing problems -changes in vision -confusion -elevated mood, decreased need for sleep, racing thoughts, impulsive behavior -eye pain -fast, irregular heartbeat -feeling faint or lightheaded, falls -feeling agitated, angry, or irritable -hallucination, loss of contact with reality -loss of balance or coordination -loss of memory -painful or prolonged erections -restlessness, pacing, inability to keep still -seizures -stiff muscles -suicidal thoughts or other mood changes -trouble sleeping -unusual bleeding or bruising -unusually weak or tired -vomiting Side effects that usually do not require medical attention (report to your doctor or health care professional if they continue or are bothersome): -change in appetite or weight -change in sex drive or performance -diarrhea -dry mouth -headache -increased sweating -nausea This list may not describe all possible side effects. Call your doctor for medical advice about side effects. You may report side effects to FDA at 1-800-FDA-1088. Where should I keep my medicine? Keep out of the reach of children. Store at room temperature between 15 and 30 degrees C (59 and 86 degrees F). Throw away any unused  medicine after the expiration date. NOTE: This sheet is a summary. It may not cover all possible information. If you have questions about this medicine, talk to your doctor, pharmacist, or health care provider.  2018 Elsevier/Gold Standard (2015-06-26 15:55:27)

## 2017-02-21 MED FILL — levETIRAcetam 750 MG TABS: 750 | 30 days supply | Qty: 60 | Fill #1

## 2017-04-23 MED FILL — levETIRAcetam 750 MG TABS: 750 | 30 days supply | Qty: 60 | Fill #2

## 2018-08-15 ENCOUNTER — Other Ambulatory Visit: Payer: Self-pay

## 2018-08-15 ENCOUNTER — Encounter (HOSPITAL_COMMUNITY): Payer: Self-pay

## 2018-08-15 ENCOUNTER — Emergency Department (HOSPITAL_COMMUNITY)
Admission: EM | Admit: 2018-08-15 | Discharge: 2018-08-15 | Disposition: A | Discharge status: Home / Self Care | Payer: Self-pay | Attending: Emergency Medicine | Admitting: Emergency Medicine

## 2018-08-15 DIAGNOSIS — Z760 Encounter for issue of repeat prescription: Secondary | ICD-10-CM

## 2018-08-15 DIAGNOSIS — G40009 Localization-related (focal) (partial) idiopathic epilepsy and epileptic syndromes with seizures of localized onset, not intractable, without status epilepticus: Secondary | ICD-10-CM | POA: Insufficient documentation

## 2018-08-15 DIAGNOSIS — F17210 Nicotine dependence, cigarettes, uncomplicated: Secondary | ICD-10-CM | POA: Insufficient documentation

## 2018-08-15 MED ORDER — LEVETIRACETAM 750 MG PO TABS
750.0000 mg | ORAL_TABLET | Freq: Once | ORAL | Status: AC
  Administered 2018-08-15: 750 mg via ORAL
  Filled 2018-08-15: qty 1

## 2018-08-15 MED ORDER — LEVETIRACETAM 500 MG PO TABS: 500.0000 mg | ORAL_TABLET | Freq: Once | ORAL | Status: DC

## 2018-08-15 MED ORDER — LEVETIRACETAM 750 MG PO TABS: 750.0000 mg | ORAL_TABLET | Freq: Two times a day (BID) | ORAL | 2 refills | Status: DC

## 2018-08-15 NOTE — ED Triage Notes (Signed)
Pt requesting a refill of his keppra, states he lost his medications.

## 2018-08-15 NOTE — Discharge Instructions (Signed)
Take your Keppra as prescribed and follow-up with a primary care doctor.  You have been given a referral to neurology.  You may return for any new or concerning symptoms.

## 2018-08-15 NOTE — ED Provider Notes (Signed)
[facility] EMERGENCY DEPARTMENT Provider Note   CSN: [id number] Arrival date & time: 08/15/18  2043     History   Chief Complaint Chief Complaint  Patient presents with  . Medication Refill    HPI Michaelanthony Kempton is a 43 y.o. male.     43 year old male with a history of epilepsy presents to the emergency department requesting a refill of his Keppra.  He states that he took his Keppra this morning, but his nephew misplaced his prescription and he was unable to take his dose at 6 PM tonight.  Is concerned that he will have a seizure if he does not get this medication refilled.  He has been seizure-free for 1 year and recently became gainfully employed.  He is concerned about losing this job if his epilepsy does not remain well controlled.  He has not had any seizures today and denies any aura preceding seizure-like activity.  The history is provided by the patient. No language interpreter was used.  Medication Refill   Past Medical History:  Diagnosis Date  . Seizures [facility])     Patient Active Problem List   Diagnosis Date Noted  . Localization-related idiopathic epilepsy and epileptic syndromes with seizures of localized onset, not intractable, without status epilepticus ([place]) 11/03/2014  . Anxiety state 11/03/2014    History reviewed. No pertinent surgical history.      Home Medications    Prior to Admission medications   Medication Sig Start Date End Date Taking? Authorizing Provider  FLUoxetine (PROZAC) 20 MG tablet Take 1 tablet (20 mg total) by mouth daily. 02/13/17   Gildardo Pounds, NP  levETIRAcetam (KEPPRA) 750 MG tablet Take 1 tablet (750 mg total) by mouth 2 (two) times daily. 08/15/18   Antonietta Breach, PA-C    Family History Family History  Problem Relation Age of Onset  . Seizures Maternal Aunt   . Sickle cell anemia Maternal Aunt   . Diabetes Mother     Social History Social History   Tobacco Use  . Smoking status: Current Every Day  Smoker    Packs/day: 0.25    Types: Cigarettes  . Smokeless tobacco: Never Used  . Tobacco comment: smokes marijuana occasionally  Substance Use Topics  . Alcohol use: No    Alcohol/week: 0.0 standard drinks    Frequency: Never    Comment: Occ   . Drug use: No     Allergies   Patient has no known allergies.   Review of Systems Review of Systems Ten systems reviewed and are negative for acute change, except as noted in the HPI.    Physical Exam Updated Vital Signs BP 120/80 (BP Location: Right Arm)   Pulse 93   Temp 98.1 F (36.7 C) (Oral)   Resp 16   SpO2 95%   Physical Exam Vitals signs and nursing note reviewed.  Constitutional:      General: He is not in acute distress.    Appearance: He is well-developed. He is not diaphoretic.     Comments: Nontoxic appearing and in NAD  HENT:     Head: Normocephalic and atraumatic.  Eyes:     General: No scleral icterus.    Conjunctiva/sclera: Conjunctivae normal.  Neck:     Musculoskeletal: Normal range of motion.  Cardiovascular:     Rate and Rhythm: Normal rate and regular rhythm.     Pulses: Normal pulses.  Pulmonary:     Effort: Pulmonary effort is normal. No respiratory distress.  Breath sounds: No stridor. No wheezing.     Comments: Respirations even and unlabored Musculoskeletal: Normal range of motion.  Skin:    General: Skin is warm and dry.     Coloration: Skin is not pale.     Findings: No erythema or rash.  Neurological:     General: No focal deficit present.     Mental Status: He is alert and oriented to person, place, and time.     Coordination: Coordination normal.     Comments: GCS 15.  Patient moving all extremities spontaneously  Psychiatric:        Behavior: Behavior normal.      ED Treatments / Results  Labs (all labs ordered are listed, but only abnormal results are displayed) Labs Reviewed - No data to display  EKG None  Radiology No results found.  Procedures Procedures  (including critical care time)  Medications Ordered in ED Medications  levETIRAcetam (KEPPRA) tablet 750 mg (has no administration in time range)     Initial Impression / Assessment and Plan / ED Course  I have reviewed the triage vital signs and the nursing notes.  Pertinent labs & imaging results that were available during my care of the patient were reviewed by me and considered in my medical decision making (see chart for details).        43 year old male presenting for refill of his Keppra prescription.  He was given a dose in the ED and prescription for refill to pick up tonight.  Will refer to primary care as well as neurology.  Return precautions discussed and provided. Patient discharged in stable condition with no unaddressed concerns.   Final Clinical Impressions(s) / ED Diagnoses   Final diagnoses:  Medication refill    ED Discharge Orders         Ordered    levETIRAcetam (KEPPRA) 750 MG tablet  2 times daily     08/15/18 2234           Antonietta Breach, PA-C 08/15/18 2256    Maudie Flakes, MD 08/19/18 [phone]

## 2018-08-15 NOTE — ED Notes (Signed)
Patient verbalized understanding of dc instructions, vss, ambulatory with nad.   

## 2018-10-23 ENCOUNTER — Other Ambulatory Visit: Payer: Self-pay

## 2018-10-23 ENCOUNTER — Emergency Department (HOSPITAL_COMMUNITY)
Admission: EM | Admit: 2018-10-23 | Discharge: 2018-10-23 | Disposition: A | Discharge status: Home / Self Care | Payer: Self-pay | Attending: Pediatric Emergency Medicine | Admitting: Pediatric Emergency Medicine

## 2018-10-23 ENCOUNTER — Encounter (HOSPITAL_COMMUNITY): Payer: Self-pay | Admitting: *Deleted

## 2018-10-23 DIAGNOSIS — F17210 Nicotine dependence, cigarettes, uncomplicated: Secondary | ICD-10-CM | POA: Insufficient documentation

## 2018-10-23 DIAGNOSIS — K029 Dental caries, unspecified: Secondary | ICD-10-CM | POA: Insufficient documentation

## 2018-10-23 DIAGNOSIS — Z79899 Other long term (current) drug therapy: Secondary | ICD-10-CM | POA: Insufficient documentation

## 2018-10-23 MED ORDER — AMOXICILLIN-POT CLAVULANATE 875-125 MG PO TABS
1.0000 | ORAL_TABLET | Freq: Two times a day (BID) | ORAL | 0 refills | Status: AC
Start: 2018-10-23 — End: 2018-10-30

## 2018-10-23 NOTE — ED Notes (Signed)
Pt. alert & interactive during discharge; pt. ambulatory to exit 

## 2018-10-23 NOTE — ED Provider Notes (Signed)
[name] Provider Note   CSN: [id number] Arrival date & time: 10/23/18  0809     History   Chief Complaint Chief Complaint  Patient presents with  . Dental Pain    HPI Brian Ward is a 43 y.o. male.     HPI   44yo with epilepsy here with worsening tooth pain. Started 9 months prior with acute worsening over the past 2-3 days.  Multiple medications attempted relief at home with minimal improvement.  No fever cough or other sick symptoms.  No seizures.  Past Medical History:  Diagnosis Date  . Seizures [facility])     Patient Active Problem List   Diagnosis Date Noted  . Localization-related idiopathic epilepsy and epileptic syndromes with seizures of localized onset, not intractable, without status epilepticus ([place]) 11/03/2014  . Anxiety state 11/03/2014    History reviewed. No pertinent surgical history.      Home Medications    Prior to Admission medications   Medication Sig Start Date End Date Taking? Authorizing Provider  amoxicillin-clavulanate (AUGMENTIN) 875-125 MG tablet Take 1 tablet by mouth every 12 (twelve) hours for 7 days. 10/23/18 10/30/18  Brent Bulla, MD  FLUoxetine (PROZAC) 20 MG tablet Take 1 tablet (20 mg total) by mouth daily. 02/13/17   Gildardo Pounds, NP  levETIRAcetam (KEPPRA) 750 MG tablet Take 1 tablet (750 mg total) by mouth 2 (two) times daily. 08/15/18   Antonietta Breach, PA-C    Family History Family History  Problem Relation Age of Onset  . Seizures Maternal Aunt   . Sickle cell anemia Maternal Aunt   . Diabetes Mother     Social History Social History   Tobacco Use  . Smoking status: Current Every Day Smoker    Packs/day: 0.25    Types: Cigarettes  . Smokeless tobacco: Never Used  . Tobacco comment: smokes marijuana occasionally  Substance Use Topics  . Alcohol use: No    Alcohol/week: 0.0 standard drinks    Frequency: Never    Comment: Occ   . Drug use: No     Allergies    Patient has no known allergies.   Review of Systems Review of Systems  Constitutional: Positive for activity change and appetite change. Negative for fever.  HENT: Positive for dental problem. Negative for congestion, facial swelling, rhinorrhea and sore throat.   Cardiovascular: Negative for chest pain.  Gastrointestinal: Negative for abdominal pain, diarrhea and vomiting.  Neurological: Negative for seizures, numbness and headaches.     Physical Exam Updated Vital Signs BP (!) 152/89 (BP Location: Right Arm)   Pulse 72   Temp 98.3 F (36.8 C) (Oral)   Resp 20   SpO2 100%   Physical Exam Vitals signs and nursing note reviewed.  Constitutional:      Appearance: He is well-developed.  HENT:     Head: Normocephalic and atraumatic.     Right Ear: There is impacted cerumen.     Left Ear: There is impacted cerumen.     Nose: No congestion or rhinorrhea.     Mouth/Throat:     Dentition: Abnormal dentition. Dental tenderness, gingival swelling and dental caries present.   Eyes:     Extraocular Movements: Extraocular movements intact.     Conjunctiva/sclera: Conjunctivae normal.     Pupils: Pupils are equal, round, and reactive to light.  Neck:     Musculoskeletal: Normal range of motion and neck supple. No neck rigidity or muscular tenderness.  Cardiovascular:  Rate and Rhythm: Normal rate and regular rhythm.     Heart sounds: No murmur.  Pulmonary:     Effort: Pulmonary effort is normal. No respiratory distress.     Breath sounds: Normal breath sounds.  Abdominal:     Palpations: Abdomen is soft.     Tenderness: There is no abdominal tenderness.  Lymphadenopathy:     Cervical: No cervical adenopathy.  Skin:    General: Skin is warm and dry.  Neurological:     Mental Status: He is alert.      ED Treatments / Results  Labs (all labs ordered are listed, but only abnormal results are displayed) Labs Reviewed - No data to display  EKG None  Radiology No  results found.  Procedures Procedures (including critical care time)  Medications Ordered in ED Medications - No data to display   Initial Impression / Assessment and Plan / ED Course  I have reviewed the triage vital signs and the nursing notes.  Pertinent labs & imaging results that were available during my care of the patient were reviewed by me and considered in my medical decision making (see chart for details).        Patient is overall well appearing with symptoms consistent with dental caries.  Exam notable for afebrile hemodynamically appropriate and stable on room air with normal saturations.  Lungs clear with good air entry.  Normal cardiac exam.  Benign abdomen.  Impacted cerumen to TMs bilaterally.  No cervical lymphadenopathy.  Normal range of motion.  Back molar caries to mandible noted with a left-sided gingival erythema without fluctuation or discharge appreciated no buccal induration appreciated.  I have considered the following complications including: Abscess deep neck infection strep other pharyngitis, and other serious bacterial illnesses.  Patient's presentation is not consistent with any of these complications.     Patient provided script for augmentin.  Return precautions discussed with patient prior to discharge and they were advised to follow with pcp as needed if symptoms worsen or fail to improve.  Final Clinical Impressions(s) / ED Diagnoses   Final diagnoses:  Dental caries    ED Discharge Orders         Ordered    amoxicillin-clavulanate (AUGMENTIN) 875-125 MG tablet  Every 12 hours     10/23/18 0934           Brent Bulla, MD 10/23/18 [phone]

## 2018-10-23 NOTE — ED Triage Notes (Signed)
Pt is here with left lower molar pain

## 2019-01-06 ENCOUNTER — Other Ambulatory Visit: Payer: Self-pay

## 2019-01-06 ENCOUNTER — Encounter: Payer: Self-pay | Admitting: *Deleted

## 2019-01-06 ENCOUNTER — Encounter (HOSPITAL_COMMUNITY): Payer: Self-pay | Admitting: Emergency Medicine

## 2019-01-06 ENCOUNTER — Emergency Department (HOSPITAL_COMMUNITY)
Admission: EM | Admit: 2019-01-06 | Discharge: 2019-01-06 | Disposition: A | Discharge status: Home / Self Care | Payer: Self-pay | Attending: Emergency Medicine | Admitting: Emergency Medicine

## 2019-01-06 DIAGNOSIS — Z79899 Other long term (current) drug therapy: Secondary | ICD-10-CM | POA: Insufficient documentation

## 2019-01-06 DIAGNOSIS — R569 Unspecified convulsions: Secondary | ICD-10-CM | POA: Insufficient documentation

## 2019-01-06 DIAGNOSIS — Z5321 Procedure and treatment not carried out due to patient leaving prior to being seen by health care provider: Secondary | ICD-10-CM | POA: Insufficient documentation

## 2019-01-06 DIAGNOSIS — Z760 Encounter for issue of repeat prescription: Secondary | ICD-10-CM | POA: Insufficient documentation

## 2019-01-06 DIAGNOSIS — F17210 Nicotine dependence, cigarettes, uncomplicated: Secondary | ICD-10-CM | POA: Insufficient documentation

## 2019-01-06 LAB — CBC WITH DIFFERENTIAL/PLATELET
Abs Immature Granulocytes: 0.02 10*3/uL (ref 0.00–0.07)
Basophils Absolute: 0 10*3/uL (ref 0.0–0.1)
Basophils Relative: 0 %
Eosinophils Absolute: 0.1 10*3/uL (ref 0.0–0.5)
Eosinophils Relative: 1 %
HCT: 38.9 % — ABNORMAL LOW (ref 39.0–52.0)
Hemoglobin: 12.7 g/dL — ABNORMAL LOW (ref 13.0–17.0)
Immature Granulocytes: 0 %
Lymphocytes Relative: 26 %
Lymphs Abs: 1.9 10*3/uL (ref 0.7–4.0)
MCH: 28.7 pg (ref 26.0–34.0)
MCHC: 32.6 g/dL (ref 30.0–36.0)
MCV: 87.8 fL (ref 80.0–100.0)
Monocytes Absolute: 0.6 10*3/uL (ref 0.1–1.0)
Monocytes Relative: 8 %
Neutro Abs: 4.7 10*3/uL (ref 1.7–7.7)
Neutrophils Relative %: 65 %
Platelets: 232 10*3/uL (ref 150–400)
RBC: 4.43 MIL/uL (ref 4.22–5.81)
RDW: 13.8 % (ref 11.5–15.5)
WBC: 7.3 10*3/uL (ref 4.0–10.5)
nRBC: 0 % (ref 0.0–0.2)

## 2019-01-06 LAB — COMPREHENSIVE METABOLIC PANEL
ALT: 11 U/L (ref 0–44)
AST: 19 U/L (ref 15–41)
Albumin: 4.5 g/dL (ref 3.5–5.0)
Alkaline Phosphatase: 85 U/L (ref 38–126)
Anion gap: 13 (ref 5–15)
BUN: 7 mg/dL (ref 6–20)
CO2: 25 mmol/L (ref 22–32)
Calcium: 9.3 mg/dL (ref 8.9–10.3)
Chloride: 98 mmol/L (ref 98–111)
Creatinine, Ser: 0.81 mg/dL (ref 0.61–1.24)
GFR calc Af Amer: >60 mL/min (ref >60)
GFR calc non Af Amer: >60 mL/min (ref >60)
Glucose, Bld: 150 mg/dL — ABNORMAL HIGH (ref 70–99)
Potassium: 3.5 mmol/L (ref 3.5–5.1)
Sodium: 136 mmol/L (ref 135–145)
Total Bilirubin: 1 mg/dL (ref 0.3–1.2)
Total Protein: 7.5 g/dL (ref 6.5–8.1)

## 2019-01-06 MED ORDER — LEVETIRACETAM IN NACL 1000 MG/100ML IV SOLN
1000.0000 mg | Freq: Once | INTRAVENOUS | Status: AC
  Administered 2019-01-06: 1000 mg via INTRAVENOUS
  Filled 2019-01-06: qty 100

## 2019-01-06 MED ORDER — LEVETIRACETAM 750 MG PO TABS: 750.0000 mg | ORAL_TABLET | Freq: Two times a day (BID) | ORAL | 0 refills | Status: DC

## 2019-01-06 NOTE — ED Notes (Signed)
Pt has left. Confirmed by registration

## 2019-01-06 NOTE — ED Triage Notes (Signed)
Pt reports that he is out of his Brian Ward for 2 days. Reports had a seizure earlier and needs a refill. Doesn't have a PCP to fill his prescriptions, so gets through ED.

## 2019-01-06 NOTE — ED Provider Notes (Signed)
[facility] Emergency Department Provider Note ___________   First MD Initiated Contact with Patient 01/06/19 2315     (approximate)  I have reviewed the triage vital signs and the nursing notes.   HISTORY  Chief Complaint Medication Refill and Seizures    HPI Brian Ward is a 43 y.o. male with history of epilepsy presents to the emergency department secondary to "I ran out of my [place] yesterday".  Patient states that he takes Keppra 750 mg twice daily with his last dose taken on Saturday.  Patient does admit to a seizure today without any trauma.  Patient denies any headache no weakness numbness gait instability or visual changes.  Patient denies any fever.        Past Medical History:  Diagnosis Date  . Seizures [facility])     Patient Active Problem List   Diagnosis Date Noted  . Localization-related idiopathic epilepsy and epileptic syndromes with seizures of localized onset, not intractable, without status epilepticus ([place]) 11/03/2014  . Anxiety state 11/03/2014    No past surgical history on file.  Prior to Admission medications   Medication Sig Start Date End Date Taking? Authorizing Provider  FLUoxetine (PROZAC) 20 MG tablet Take 1 tablet (20 mg total) by mouth daily. 02/13/17   Gildardo Pounds, NP  levETIRAcetam (KEPPRA) 750 MG tablet Take 1 tablet (750 mg total) by mouth 2 (two) times daily. 08/15/18   Antonietta Breach, PA-C    Allergies Patient has no known allergies.  Family History  Problem Relation Age of Onset  . Seizures Maternal Aunt   . Sickle cell anemia Maternal Aunt   . Diabetes Mother     Social History Social History   Tobacco Use  . Smoking status: Current Every Day Smoker    Packs/day: 0.25    Types: Cigarettes  . Smokeless tobacco: Never Used  . Tobacco comment: smokes marijuana occasionally  Substance Use Topics  . Alcohol use: Yes    Alcohol/week: 0.0 standard drinks    Frequency: Never    Comment: Occ   .  Drug use: No    Review of Systems Constitutional: No fever/chills Eyes: No visual changes. ENT: No sore throat. Cardiovascular: Denies chest pain. Respiratory: Denies shortness of breath. Gastrointestinal: No abdominal pain.  No nausea, no vomiting.  No diarrhea.  No constipation. Genitourinary: Negative for dysuria. Musculoskeletal: Negative for neck pain.  Negative for back pain. Integumentary: Negative for rash. Neurological: Negative for headaches, focal weakness or numbness.  ____________________________________________   PHYSICAL EXAM:  VITAL SIGNS: ED Triage Vitals  Enc Vitals Group     BP 01/06/19 2139 (!) 169/93     Pulse Rate 01/06/19 2139 86     Resp 01/06/19 2139 18     Temp 01/06/19 2139 98.4 F (36.9 C)     Temp Source 01/06/19 2139 Oral     SpO2 01/06/19 2139 100 %     Weight 01/06/19 2140 84.8 kg (187 lb)     Height 01/06/19 2140 1.727 m (5\' 8" )     Head Circumference --      Peak Flow --      Pain Score --      Pain Loc --      Pain Edu? --      Excl. in [place]? --     Constitutional: Alert and oriented.  Eyes: Conjunctivae are normal.  Head: Atraumatic. Mouth/Throat: Patient is wearing a mask. Neck: No stridor.  No meningeal signs.  Cardiovascular: Normal rate, regular rhythm. Good peripheral circulation. Grossly normal heart sounds. Respiratory: Normal respiratory effort.  No retractions. Gastrointestinal: Soft and nontender. No distention.  Musculoskeletal: No lower extremity tenderness nor edema. No gross deformities of extremities. Neurologic:  Normal speech and language. No gross focal neurologic deficits are appreciated.  Skin:  Skin is warm, dry and intact. Psychiatric: Mood and affect are normal. Speech and behavior are normal.  ____________________________________________   LABS (all labs ordered are listed, but only abnormal results are displayed)  Labs Reviewed  CBC WITH DIFFERENTIAL/PLATELET - Abnormal; Notable for the following  components:      Result Value   Hemoglobin 12.7 (*)    HCT 38.9 (*)    All other components within normal limits  COMPREHENSIVE METABOLIC PANEL - Abnormal; Notable for the following components:   Glucose, Bld 150 (*)    All other components within normal limits  CBG MONITORING, ED      Procedures   ____________________________________________   INITIAL IMPRESSION / MDM / ASSESSMENT AND PLAN / ED COURSE  As part of my medical decision making, I reviewed the following data within the electronic MEDICAL RECORD NUMBER   43 year old male presented with above-stated history and physical exam secondary to running out of antiepileptic medications and seizure activity today.  Patient given Keppra 1 g IV.  Patient will be prescribed Keppra 750 mg twice daily as he is prescribed.  Patient advised to follow-up with his neurologist.  ____________________________________________  FINAL CLINICAL IMPRESSION(S) / ED DIAGNOSES  Final diagnoses:  Seizure disorder ([place])     MEDICATIONS GIVEN DURING THIS VISIT:  Medications  levETIRAcetam (KEPPRA) IVPB 1000 mg/100 mL premix (has no administration in time range)     ED Discharge Orders    None      *Please note:  Brian Ward was evaluated in Emergency Department on 01/06/2019 for the symptoms described in the history of present illness. He was evaluated in the context of the global COVID-19 pandemic, which necessitated consideration that the patient might be at risk for infection with the SARS-CoV-2 virus that causes COVID-19. Institutional protocols and algorithms that pertain to the evaluation of patients at risk for COVID-19 are in a state of rapid change based on information released by regulatory bodies including the CDC and federal and state organizations. These policies and algorithms were followed during the patient's care in the ED.  Some ED evaluations and interventions may be delayed as a result of limited staffing during the pandemic.*   Note:  This document was prepared using Dragon voice recognition software and may include unintentional dictation errors.   Gregor Hams, MD 01/06/19 [phone]

## 2019-01-06 NOTE — ED Triage Notes (Signed)
Pt out of keppra since yesterday.  Pt had a seizure today.  Pt requesting med refill.  Pt alert  Speech clear.

## 2019-01-07 ENCOUNTER — Emergency Department
Admission: EM | Admit: 2019-01-07 | Discharge: 2019-01-07 | Disposition: A | Discharge status: Home / Self Care | Payer: Self-pay | Attending: Emergency Medicine | Admitting: Emergency Medicine

## 2019-01-07 DIAGNOSIS — G40909 Epilepsy, unspecified, not intractable, without status epilepticus: Secondary | ICD-10-CM

## 2019-08-01 ENCOUNTER — Other Ambulatory Visit: Payer: Self-pay

## 2019-08-01 ENCOUNTER — Encounter (HOSPITAL_COMMUNITY): Payer: Self-pay

## 2019-08-01 ENCOUNTER — Encounter: Payer: Self-pay | Admitting: Neurology

## 2019-08-01 ENCOUNTER — Emergency Department (HOSPITAL_COMMUNITY)
Admission: EM | Admit: 2019-08-01 | Discharge: 2019-08-01 | Disposition: A | Discharge status: Home / Self Care | Payer: Self-pay | Attending: Emergency Medicine | Admitting: Emergency Medicine

## 2019-08-01 DIAGNOSIS — F17210 Nicotine dependence, cigarettes, uncomplicated: Secondary | ICD-10-CM | POA: Insufficient documentation

## 2019-08-01 DIAGNOSIS — Z79899 Other long term (current) drug therapy: Secondary | ICD-10-CM | POA: Insufficient documentation

## 2019-08-01 DIAGNOSIS — R569 Unspecified convulsions: Secondary | ICD-10-CM | POA: Insufficient documentation

## 2019-08-01 DIAGNOSIS — G40919 Epilepsy, unspecified, intractable, without status epilepticus: Secondary | ICD-10-CM

## 2019-08-01 LAB — CBC WITH DIFFERENTIAL/PLATELET
Abs Immature Granulocytes: 0.01 10*3/uL (ref 0.00–0.07)
Basophils Absolute: 0 10*3/uL (ref 0.0–0.1)
Basophils Relative: 0 %
Eosinophils Absolute: 0.1 10*3/uL (ref 0.0–0.5)
Eosinophils Relative: 2 %
HCT: 40.7 % (ref 39.0–52.0)
Hemoglobin: 13.1 g/dL (ref 13.0–17.0)
Immature Granulocytes: 0 %
Lymphocytes Relative: 40 %
Lymphs Abs: 1.9 10*3/uL (ref 0.7–4.0)
MCH: 29.4 pg (ref 26.0–34.0)
MCHC: 32.2 g/dL (ref 30.0–36.0)
MCV: 91.5 fL (ref 80.0–100.0)
Monocytes Absolute: 0.4 10*3/uL (ref 0.1–1.0)
Monocytes Relative: 8 %
Neutro Abs: 2.3 10*3/uL (ref 1.7–7.7)
Neutrophils Relative %: 50 %
Platelets: 241 10*3/uL (ref 150–400)
RBC: 4.45 MIL/uL (ref 4.22–5.81)
RDW: 13.4 % (ref 11.5–15.5)
WBC: 4.8 10*3/uL (ref 4.0–10.5)
nRBC: 0 % (ref 0.0–0.2)

## 2019-08-01 LAB — COMPREHENSIVE METABOLIC PANEL
ALT: 13 U/L (ref 0–44)
AST: 26 U/L (ref 15–41)
Albumin: 4.5 g/dL (ref 3.5–5.0)
Alkaline Phosphatase: 78 U/L (ref 38–126)
Anion gap: 12 (ref 5–15)
BUN: 9 mg/dL (ref 6–20)
CO2: 25 mmol/L (ref 22–32)
Calcium: 9.3 mg/dL (ref 8.9–10.3)
Chloride: 102 mmol/L (ref 98–111)
Creatinine, Ser: 0.8 mg/dL (ref 0.61–1.24)
GFR calc Af Amer: >60 mL/min (ref >60)
GFR calc non Af Amer: >60 mL/min (ref >60)
Glucose, Bld: 92 mg/dL (ref 70–99)
Potassium: 3.4 mmol/L — ABNORMAL LOW (ref 3.5–5.1)
Sodium: 139 mmol/L (ref 135–145)
Total Bilirubin: 1.5 mg/dL — ABNORMAL HIGH (ref 0.3–1.2)
Total Protein: 7.3 g/dL (ref 6.5–8.1)

## 2019-08-01 LAB — MAGNESIUM: Magnesium: 1.9 mg/dL (ref 1.7–2.4)

## 2019-08-01 MED ORDER — LEVETIRACETAM 1000 MG PO TABS: 1000.0000 mg | ORAL_TABLET | Freq: Two times a day (BID) | ORAL | 0 refills | Status: DC

## 2019-08-01 MED FILL — levETIRAcetam 1000 MG TABS: 1000 | 30 days supply | Qty: 60 | Fill #0

## 2019-08-01 NOTE — ED Provider Notes (Signed)
[facility] EMERGENCY DEPARTMENT Provider Note   CSN: [id number] Arrival date & time: 08/01/19  2423     History Chief Complaint  Patient presents with  . Seizures    Brian Ward is a 44 y.o. male.  HPI   Pt is a 44 y/o male with a h/o seizures who presents the emergency department today requesting a medical evaluation.  States that he has a history of seizures and previously has been prescribed 750 mg twice daily of Keppra.  He states that whenever he starts a new job during the middle of the day he feels like he is going to have a seizure.  He therefore increased his own dose of Keppra to 750 mg 3 times daily.  States that he does this every time he gets a new job and has had an increased dose of Keppra over the last week or so.  States that when he takes this dose of medication it makes him feel "doped up".  He recently moved back to the area and does not have a neurologist to help titrate his medications and is requesting a referral.  States his last breakthrough seizure was 1 month ago.  Prior to this he has had about 3-4 breakthrough seizures per year on 750 mg twice daily of Keppra.  He has no specific complaints today.  Past Medical History:  Diagnosis Date  . Seizures [facility])     Patient Active Problem List   Diagnosis Date Noted  . Localization-related idiopathic epilepsy and epileptic syndromes with seizures of localized onset, not intractable, without status epilepticus ([place]) 11/03/2014  . Anxiety state 11/03/2014    History reviewed. No pertinent surgical history.     Family History  Problem Relation Age of Onset  . Seizures Maternal Aunt   . Sickle cell anemia Maternal Aunt   . Diabetes Mother     Social History   Tobacco Use  . Smoking status: Current Every Day Smoker    Packs/day: 0.25    Types: Cigarettes  . Smokeless tobacco: Never Used  . Tobacco comment: smokes marijuana occasionally  Substance Use Topics  . Alcohol use: Yes     Alcohol/week: 0.0 standard drinks    Comment: Occ   . Drug use: No    Home Medications Prior to Admission medications   Medication Sig Start Date End Date Taking? Authorizing Provider  FLUoxetine (PROZAC) 20 MG tablet Take 1 tablet (20 mg total) by mouth daily. 02/13/17   Gildardo Pounds, NP  levETIRAcetam (KEPPRA) 1000 MG tablet Take 1 tablet (1,000 mg total) by mouth 2 (two) times daily. 08/01/19 08/31/19  Zakayla Martinec S, PA-C    Allergies    Patient has no known allergies.  Review of Systems   Review of Systems  Constitutional: Negative for fever.  HENT: Negative for ear pain and sore throat.   Eyes: Negative for visual disturbance.  Respiratory: Negative for cough and shortness of breath.   Cardiovascular: Negative for chest pain.  Gastrointestinal: Negative for abdominal pain and vomiting.  Genitourinary: Negative for dysuria and hematuria.  Musculoskeletal: Negative for back pain.  Skin: Negative for rash.  Neurological: Positive for seizures (1 month ago). Negative for syncope.  All other systems reviewed and are negative.   Physical Exam Updated Vital Signs BP 116/82 (BP Location: Right Arm)   Pulse 82   Temp 99.1 F (37.3 C) (Oral)   Resp 18   Ht 5\' 9"  (1.753 m)   Wt 84.8 kg  SpO2 95%   BMI 27.62 kg/m   Physical Exam Vitals and nursing note reviewed.  Constitutional:      Appearance: He is well-developed.  HENT:     Head: Normocephalic and atraumatic.  Eyes:     Conjunctiva/sclera: Conjunctivae normal.  Cardiovascular:     Rate and Rhythm: Normal rate and regular rhythm.     Heart sounds: No murmur heard.   Pulmonary:     Effort: Pulmonary effort is normal. No respiratory distress.     Breath sounds: Normal breath sounds.  Abdominal:     Palpations: Abdomen is soft.     Tenderness: There is no abdominal tenderness.  Musculoskeletal:     Cervical back: Neck supple.  Skin:    General: Skin is warm and dry.  Neurological:     Mental Status: He  is alert.     Comments: Mental Status:  Alert, thought content appropriate, able to give a coherent history. Speech fluent without evidence of aphasia. Able to follow 2 step commands without difficulty.  Cranial Nerves:  II: pupils equal, round, reactive to light III,IV, VI: ptosis not present, extra-ocular motions intact bilaterally  V,VII: smile symmetric, facial light touch sensation equal VIII: hearing grossly normal to voice  X: uvula elevates symmetrically  XI: bilateral shoulder shrug symmetric and strong XII: midline tongue extension without fassiculations Motor:  Normal tone. 5/5 strength of BUE and BLE major muscle groups including strong and equal grip strength and dorsiflexion/plantar flexion Sensory: light touch normal in all extremities.  Psychiatric:     Comments: anxious     ED Results / Procedures / Treatments   Labs (all labs ordered are listed, but only abnormal results are displayed) Labs Reviewed  COMPREHENSIVE METABOLIC PANEL - Abnormal; Notable for the following components:      Result Value   Potassium 3.4 (*)    Total Bilirubin 1.5 (*)    All other components within normal limits  CBC WITH DIFFERENTIAL/PLATELET  MAGNESIUM  LEVETIRACETAM LEVEL    EKG None  Radiology No results found.  Procedures Procedures (including critical care time)  Medications Ordered in ED Medications - No data to display  ED Course  I have reviewed the triage vital signs and the nursing notes.  Pertinent labs & imaging results that were available during my care of the patient were reviewed by me and considered in my medical decision making (see chart for details).    MDM Rules/Calculators/A&P                          44 year old male here requesting medical evaluation.  Has history of seizures and states every time he gets a job he increases his Keppra to 3 times daily instead of the prescribed twice daily dosing.  When he takes the third dose it makes him feel  "doped up ".  He is here to be evaluated for this feeling and to get an neurology referral.  He has no other specific complaints today.  Patient's vital signs are within normal limits.  His neurologic exam is normal.  Labs ordered in triage were reviewed and CBC is without leukocytosis or anemia, electrolytes are within normal limits, kidney and liver function are normal.  Bicarb normal.  Magnesium is normal.  Keppra level was sent off to get a baseline however this will not result for 2 days.    12:55 PM CONSULT with Dr. Lorraine Lax with neurology who recommends increasing the patients dose  to 1000mg  BID.  Discussed plan for increased dosing with patient.  Strongly advised that he take medications as directed and do not titrate medications himself.  Also advised him that he should not be driving until he is cleared by neurology.  Advised him to contact [facility] neurology whom he is previously followed with referral was also given.  Advised on strict return precautions.  Patient voices understanding of the plan and reasons to return.  All questions answered.  Patient stable for discharge.   Final Clinical Impression(s) / ED Diagnoses Final diagnoses:  Breakthrough seizure ([place])    Rx / DC Orders ED Discharge Orders         Ordered    Ambulatory referral to Neurology     Discontinue  Reprint    Comments: An appointment is requested in approximately: 2 weeks  Patient has previously been established with Dr. Delice Lesch however he has not seen her since 2016 and will need to get reestablished.   08/01/19 1300    levETIRAcetam (KEPPRA) 1000 MG tablet  2 times daily     Discontinue  Reprint     08/01/19 1302           Rodney Booze, PA-C 08/01/19 1304    Blanchie Dessert, MD 08/02/19 2042

## 2019-08-01 NOTE — Discharge Instructions (Signed)
Start taking 1000 mg of Keppra twice daily instead of your prior dosing. Take the medication as prescribed. Do NOT take more medication than we have recommended today unless you are told to do so by a provider.   You should not drive until you are cleared by neurology.   You need to call [facility] to set up an appointment for follow up.   Please return to the emergency department for any new or worsening symptoms.

## 2019-08-01 NOTE — ED Triage Notes (Signed)
Pt reports hx of epilepsy and states recently he increased his keppra to 750mg  three times a day in order to be able to work and not have a seizure, pt states he needs a referral to a neurologist. Pt left work today to come here due to feeling like he was going to have a seizure. Pt has not seen a neurologist in a few years. Pt a.o, nad noted at this time.

## 2019-10-03 ENCOUNTER — Other Ambulatory Visit: Payer: Self-pay

## 2019-10-03 ENCOUNTER — Encounter (HOSPITAL_COMMUNITY): Payer: Self-pay

## 2019-10-03 ENCOUNTER — Emergency Department (HOSPITAL_COMMUNITY)
Admission: EM | Admit: 2019-10-03 | Discharge: 2019-10-03 | Disposition: A | Discharge status: Home / Self Care | Payer: Self-pay | Attending: Emergency Medicine | Admitting: Emergency Medicine

## 2019-10-03 DIAGNOSIS — Z20822 Contact with and (suspected) exposure to covid-19: Secondary | ICD-10-CM | POA: Insufficient documentation

## 2019-10-03 DIAGNOSIS — Z5321 Procedure and treatment not carried out due to patient leaving prior to being seen by health care provider: Secondary | ICD-10-CM | POA: Insufficient documentation

## 2019-10-03 DIAGNOSIS — R0981 Nasal congestion: Secondary | ICD-10-CM | POA: Insufficient documentation

## 2019-10-03 DIAGNOSIS — R52 Pain, unspecified: Secondary | ICD-10-CM | POA: Insufficient documentation

## 2019-10-03 LAB — SARS CORONAVIRUS 2 BY RT PCR (HOSPITAL ORDER, PERFORMED IN ~~LOC~~ HOSPITAL LAB): SARS Coronavirus 2: NEGATIVE

## 2019-10-03 MED ORDER — LEVETIRACETAM 750 MG PO TABS
750.0000 mg | ORAL_TABLET | Freq: Once | ORAL | Status: AC
  Administered 2019-10-03: 750 mg via ORAL
  Filled 2019-10-03 (×2): qty 1

## 2019-10-03 NOTE — ED Triage Notes (Signed)
Pt presents with congestion and generalized body aches. Reports several people at work recently tested + for covid

## 2019-10-30 ENCOUNTER — Encounter: Payer: Self-pay | Admitting: Neurology

## 2019-10-30 ENCOUNTER — Other Ambulatory Visit: Payer: Self-pay

## 2019-10-30 ENCOUNTER — Other Ambulatory Visit: Payer: Self-pay | Admitting: Neurology

## 2019-10-30 ENCOUNTER — Ambulatory Visit (INDEPENDENT_AMBULATORY_CARE_PROVIDER_SITE_OTHER): Payer: Self-pay | Admitting: Neurology

## 2019-10-30 VITALS — BP 109/73 | HR 79 | Ht 68.0 in | Wt 183.2 lb

## 2019-10-30 DIAGNOSIS — G40009 Localization-related (focal) (partial) idiopathic epilepsy and epileptic syndromes with seizures of localized onset, not intractable, without status epilepticus: Secondary | ICD-10-CM

## 2019-10-30 MED ORDER — LEVETIRACETAM 1000 MG PO TABS: 1000.0000 mg | ORAL_TABLET | Freq: Two times a day (BID) | ORAL | 11 refills | Status: DC

## 2019-10-30 MED FILL — levETIRAcetam 1000 MG TABS: 1000 | 30 days supply | Qty: 60 | Fill #0
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
## 2019-10-30 NOTE — Patient Instructions (Signed)
1. Increase Keppra to 1000mg : take 1 tablet twice a day  2. Contact the [facility] [place] and see what resources they can help you with, such as medications, support, and guidance for avenues for work Local: (669)115-7509 Helpline: (800) 878-6767  3. As per [place] driving laws, no driving until 6 months seizure-free  4. Follow-up in 3 months, call for any changes   Seizure Precautions: 1. If medication has been prescribed for you to prevent seizures, take it exactly as directed.  Do not stop taking the medicine without talking to your doctor first, even if you have not had a seizure in a long time.   2. Avoid activities in which a seizure would cause danger to yourself or to others.  Don't operate dangerous machinery, swim alone, or climb in high or dangerous places, such as on ladders, roofs, or girders.  Do not drive unless your doctor says you may.  3. If you have any warning that you may have a seizure, lay down in a safe place where you can't hurt yourself.    4.  No driving for 6 months from last seizure, as per [facility].   Please refer to the following link on the [facility] website for more information: http://www.epilepsyfoundation.org/answerplace/Social/driving/drivingu.cfm   5.  Maintain good sleep hygiene. Avoid alcohol.  6.  Contact your doctor if you have any problems that may be related to the medicine you are taking.  7.  Call 911 and bring the patient back to the ED if:        A.  The seizure lasts longer than 5 minutes.       B.  The patient doesn't awaken shortly after the seizure  C.  The patient has new problems such as difficulty seeing, speaking or moving  D.  The patient was injured during the seizure  E.  The patient has a temperature over 102 F (39C)  F.  The patient vomited and now is having trouble breathing

## 2019-11-21 MED FILL — levETIRAcetam 1000 MG TABS: 1000 | 30 days supply | Qty: 60 | Fill #0

## 2019-12-26 MED FILL — levETIRAcetam 1000 MG TABS: 1000 | 30 days supply | Qty: 60 | Fill #1

## 2020-01-19 ENCOUNTER — Encounter (HOSPITAL_COMMUNITY): Payer: Self-pay

## 2020-01-19 ENCOUNTER — Other Ambulatory Visit: Payer: Self-pay

## 2020-01-19 ENCOUNTER — Emergency Department (HOSPITAL_COMMUNITY)
Admission: EM | Admit: 2020-01-19 | Discharge: 2020-01-19 | Disposition: A | Discharge status: Home / Self Care | Payer: Self-pay | Attending: Emergency Medicine | Admitting: Emergency Medicine

## 2020-01-19 ENCOUNTER — Emergency Department (HOSPITAL_COMMUNITY): Payer: Self-pay

## 2020-01-19 DIAGNOSIS — R0602 Shortness of breath: Secondary | ICD-10-CM | POA: Insufficient documentation

## 2020-01-19 DIAGNOSIS — R55 Syncope and collapse: Secondary | ICD-10-CM | POA: Insufficient documentation

## 2020-01-19 DIAGNOSIS — R0789 Other chest pain: Secondary | ICD-10-CM | POA: Insufficient documentation

## 2020-01-19 DIAGNOSIS — F17290 Nicotine dependence, other tobacco product, uncomplicated: Secondary | ICD-10-CM | POA: Insufficient documentation

## 2020-01-19 DIAGNOSIS — R079 Chest pain, unspecified: Secondary | ICD-10-CM

## 2020-01-19 LAB — CBC
HCT: 41 % (ref 39.0–52.0)
Hemoglobin: 13.1 g/dL (ref 13.0–17.0)
MCH: 29.4 pg (ref 26.0–34.0)
MCHC: 32 g/dL (ref 30.0–36.0)
MCV: 92.1 fL (ref 80.0–100.0)
Platelets: 228 10*3/uL (ref 150–400)
RBC: 4.45 MIL/uL (ref 4.22–5.81)
RDW: 14 % (ref 11.5–15.5)
WBC: 4.4 10*3/uL (ref 4.0–10.5)
nRBC: 0 % (ref 0.0–0.2)

## 2020-01-19 LAB — BASIC METABOLIC PANEL
Anion gap: 9 (ref 5–15)
BUN: 7 mg/dL (ref 6–20)
CO2: 26 mmol/L (ref 22–32)
Calcium: 9.1 mg/dL (ref 8.9–10.3)
Chloride: 104 mmol/L (ref 98–111)
Creatinine, Ser: 0.81 mg/dL (ref 0.61–1.24)
GFR, Estimated: >60 mL/min (ref >60)
Glucose, Bld: 101 mg/dL — ABNORMAL HIGH (ref 70–99)
Potassium: 3.8 mmol/L (ref 3.5–5.1)
Sodium: 139 mmol/L (ref 135–145)

## 2020-01-19 LAB — TROPONIN I (HIGH SENSITIVITY)
Troponin I (High Sensitivity): <2 ng/L (ref <18)
Troponin I (High Sensitivity): <2 ng/L (ref <18)

## 2020-01-19 LAB — CBG MONITORING, ED: Glucose-Capillary: 118 mg/dL — ABNORMAL HIGH (ref 70–99)

## 2020-01-19 MED ORDER — LEVETIRACETAM 500 MG PO TABS
1000.0000 mg | ORAL_TABLET | Freq: Once | ORAL | Status: AC
  Administered 2020-01-19: 20:00:00 1000 mg via ORAL
  Filled 2020-01-19: qty 2

## 2020-01-19 NOTE — Discharge Instructions (Addendum)
The testing today does not show any problems with your heart.  Try taking Tylenol 650 mg every 4 hours for pain.  Also try using Maalox, 2 tablespoons, before meals and at bedtime for a week or 2.  Both of these medications can improve your pain.  If you do not get better, follow-up with your doctor for further care.

## 2020-01-19 NOTE — ED Triage Notes (Signed)
Patient c/o mid chest pain and feeling near syncope x 1 1/2 hours ago. patient also c/o SOB and nausea.
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
## 2020-01-23 MED FILL — levETIRAcetam 1000 MG TABS: 1000 | 30 days supply | Qty: 60 | Fill #2

## 2020-02-23 ENCOUNTER — Other Ambulatory Visit: Payer: Self-pay

## 2020-02-23 ENCOUNTER — Encounter: Payer: Self-pay | Admitting: Neurology

## 2020-02-23 ENCOUNTER — Telehealth (INDEPENDENT_AMBULATORY_CARE_PROVIDER_SITE_OTHER): Payer: Self-pay | Admitting: Neurology

## 2020-02-23 ENCOUNTER — Other Ambulatory Visit: Payer: Self-pay | Admitting: Neurology

## 2020-02-23 VITALS — Wt 189.0 lb

## 2020-02-23 DIAGNOSIS — G40009 Localization-related (focal) (partial) idiopathic epilepsy and epileptic syndromes with seizures of localized onset, not intractable, without status epilepticus: Secondary | ICD-10-CM

## 2020-02-23 MED ORDER — LEVETIRACETAM 1000 MG PO TABS
1000.0000 mg | ORAL_TABLET | Freq: Two times a day (BID) | ORAL | 1 refills | Status: DC
Start: 2020-02-23 — End: 2020-02-23

## 2020-02-23 MED ORDER — OXCARBAZEPINE 300 MG PO TABS: ORAL_TABLET | ORAL | 5 refills | Status: DC

## 2020-02-23 MED FILL — levETIRAcetam 1000 MG TABS: 1000 | 30 days supply | Qty: 60 | Fill #0

## 2020-02-23 MED FILL — OXcarbazepine 300 MG TABS: 300 | 21 days supply | Qty: 84 | Fill #0
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
## 2020-02-27 MED ORDER — CEFAZOLIN SODIUM-DEXTROSE 2-4 GM/100ML-% IV SOLN
INTRAVENOUS | Status: AC
  Filled 2020-02-27: qty 100

## 2020-03-24 ENCOUNTER — Inpatient Hospital Stay (HOSPITAL_COMMUNITY)
Admission: EM | Admit: 2020-03-24 | Discharge: 2020-03-26 | DRG: 100 | Disposition: A | Discharge status: Home / Self Care | Payer: Self-pay | Attending: Pulmonary Disease | Admitting: Pulmonary Disease

## 2020-03-24 ENCOUNTER — Emergency Department (HOSPITAL_COMMUNITY): Payer: Self-pay

## 2020-03-24 ENCOUNTER — Inpatient Hospital Stay (HOSPITAL_COMMUNITY): Payer: Self-pay

## 2020-03-24 ENCOUNTER — Encounter (HOSPITAL_COMMUNITY): Payer: Self-pay | Admitting: Radiology

## 2020-03-24 DIAGNOSIS — Z9911 Dependence on respirator [ventilator] status: Secondary | ICD-10-CM

## 2020-03-24 DIAGNOSIS — E872 Acidosis: Secondary | ICD-10-CM

## 2020-03-24 DIAGNOSIS — J9601 Acute respiratory failure with hypoxia: Secondary | ICD-10-CM

## 2020-03-24 DIAGNOSIS — T1490XA Injury, unspecified, initial encounter: Secondary | ICD-10-CM

## 2020-03-24 DIAGNOSIS — R569 Unspecified convulsions: Principal | ICD-10-CM

## 2020-03-24 DIAGNOSIS — R402422 Glasgow coma scale score 9-12, at arrival to emergency department: Secondary | ICD-10-CM

## 2020-03-24 DIAGNOSIS — F17200 Nicotine dependence, unspecified, uncomplicated: Secondary | ICD-10-CM | POA: Diagnosis present

## 2020-03-24 DIAGNOSIS — G40901 Epilepsy, unspecified, not intractable, with status epilepticus: Principal | ICD-10-CM | POA: Diagnosis present

## 2020-03-24 DIAGNOSIS — F1290 Cannabis use, unspecified, uncomplicated: Secondary | ICD-10-CM | POA: Diagnosis present

## 2020-03-24 DIAGNOSIS — E875 Hyperkalemia: Secondary | ICD-10-CM | POA: Diagnosis present

## 2020-03-24 DIAGNOSIS — F1210 Cannabis abuse, uncomplicated: Secondary | ICD-10-CM

## 2020-03-24 DIAGNOSIS — Z79899 Other long term (current) drug therapy: Secondary | ICD-10-CM

## 2020-03-24 DIAGNOSIS — Y92410 Unspecified street and highway as the place of occurrence of the external cause: Secondary | ICD-10-CM

## 2020-03-24 DIAGNOSIS — Z820 Family history of epilepsy and other diseases of the nervous system: Secondary | ICD-10-CM

## 2020-03-24 DIAGNOSIS — Z833 Family history of diabetes mellitus: Secondary | ICD-10-CM

## 2020-03-24 DIAGNOSIS — R739 Hyperglycemia, unspecified: Secondary | ICD-10-CM | POA: Diagnosis present

## 2020-03-24 DIAGNOSIS — G9349 Other encephalopathy: Secondary | ICD-10-CM | POA: Diagnosis present

## 2020-03-24 DIAGNOSIS — Z20822 Contact with and (suspected) exposure to covid-19: Secondary | ICD-10-CM | POA: Diagnosis present

## 2020-03-24 LAB — HEPATIC FUNCTION PANEL
ALT: 13 U/L (ref 0–44)
AST: 19 U/L (ref 15–41)
Albumin: 3.7 g/dL (ref 3.5–5.0)
Alkaline Phosphatase: 75 U/L (ref 38–126)
Bilirubin, Direct: <0.1 mg/dL (ref 0.0–0.2)
Total Bilirubin: 0.3 mg/dL (ref 0.3–1.2)
Total Protein: 6.2 g/dL — ABNORMAL LOW (ref 6.5–8.1)

## 2020-03-24 LAB — I-STAT ARTERIAL BLOOD GAS, ED
Acid-Base Excess: 2 mmol/L (ref 0.0–2.0)
Bicarbonate: 27.4 mmol/L (ref 20.0–28.0)
Calcium, Ion: 1.2 mmol/L (ref 1.15–1.40)
HCT: 36 % — ABNORMAL LOW (ref 39.0–52.0)
Hemoglobin: 12.2 g/dL — ABNORMAL LOW (ref 13.0–17.0)
O2 Saturation: 100 %
Potassium: 3.2 mmol/L — ABNORMAL LOW (ref 3.5–5.1)
Sodium: 140 mmol/L (ref 135–145)
TCO2: 29 mmol/L (ref 22–32)
pCO2 arterial: 45.9 mmHg (ref 32.0–48.0)
pH, Arterial: 7.385 (ref 7.350–7.450)
pO2, Arterial: 549 mmHg — ABNORMAL HIGH (ref 83.0–108.0)

## 2020-03-24 LAB — COMPREHENSIVE METABOLIC PANEL
ALT: 15 U/L (ref 0–44)
AST: 17 U/L (ref 15–41)
Albumin: 3.6 g/dL (ref 3.5–5.0)
Alkaline Phosphatase: 76 U/L (ref 38–126)
Anion gap: 8 (ref 5–15)
BUN: 7 mg/dL (ref 6–20)
CO2: 25 mmol/L (ref 22–32)
Calcium: 8.5 mg/dL — ABNORMAL LOW (ref 8.9–10.3)
Chloride: 105 mmol/L (ref 98–111)
Creatinine, Ser: 0.93 mg/dL (ref 0.61–1.24)
GFR, Estimated: >60 mL/min (ref >60)
Glucose, Bld: 79 mg/dL (ref 70–99)
Potassium: 3.3 mmol/L — ABNORMAL LOW (ref 3.5–5.1)
Sodium: 138 mmol/L (ref 135–145)
Total Bilirubin: 0.5 mg/dL (ref 0.3–1.2)
Total Protein: 6.1 g/dL — ABNORMAL LOW (ref 6.5–8.1)

## 2020-03-24 LAB — GLUCOSE, CAPILLARY: Glucose-Capillary: 89 mg/dL (ref 70–99)

## 2020-03-24 LAB — CBC
HCT: 47.4 % (ref 39.0–52.0)
HCT: 36.8 % — ABNORMAL LOW (ref 39.0–52.0)
Hemoglobin: 14.6 g/dL (ref 13.0–17.0)
Hemoglobin: 12 g/dL — ABNORMAL LOW (ref 13.0–17.0)
MCH: 29.2 pg (ref 26.0–34.0)
MCH: 29.1 pg (ref 26.0–34.0)
MCHC: 30.8 g/dL (ref 30.0–36.0)
MCHC: 32.6 g/dL (ref 30.0–36.0)
MCV: 94.8 fL (ref 80.0–100.0)
MCV: 89.3 fL (ref 80.0–100.0)
Platelets: 216 10*3/uL (ref 150–400)
Platelets: 230 10*3/uL (ref 150–400)
RBC: 5 MIL/uL (ref 4.22–5.81)
RBC: 4.12 MIL/uL — ABNORMAL LOW (ref 4.22–5.81)
RDW: 14.6 % (ref 11.5–15.5)
RDW: 14.2 % (ref 11.5–15.5)
WBC: 5.7 10*3/uL (ref 4.0–10.5)
WBC: 7.3 10*3/uL (ref 4.0–10.5)
nRBC: 0 % (ref 0.0–0.2)
nRBC: 0 % (ref 0.0–0.2)

## 2020-03-24 LAB — URINALYSIS, ROUTINE W REFLEX MICROSCOPIC
Bilirubin Urine: NEGATIVE
Glucose, UA: NEGATIVE mg/dL
Hgb urine dipstick: NEGATIVE
Ketones, ur: NEGATIVE mg/dL
Leukocytes,Ua: NEGATIVE
Nitrite: NEGATIVE
Protein, ur: 100 mg/dL — AB
Specific Gravity, Urine: 1.039 — ABNORMAL HIGH (ref 1.005–1.030)
pH: 5 (ref 5.0–8.0)

## 2020-03-24 LAB — LACTIC ACID, PLASMA
Lactic Acid, Venous: >11 mmol/L (ref 0.5–1.9)
Lactic Acid, Venous: 2.2 mmol/L (ref 0.5–1.9)

## 2020-03-24 LAB — I-STAT CHEM 8, ED
BUN: 11 mg/dL (ref 6–20)
Calcium, Ion: 0.99 mmol/L — ABNORMAL LOW (ref 1.15–1.40)
Chloride: 105 mmol/L (ref 98–111)
Creatinine, Ser: 1 mg/dL (ref 0.61–1.24)
Glucose, Bld: 157 mg/dL — ABNORMAL HIGH (ref 70–99)
HCT: 49 % (ref 39.0–52.0)
Hemoglobin: 16.7 g/dL (ref 13.0–17.0)
Potassium: 7.8 mmol/L (ref 3.5–5.1)
Sodium: 135 mmol/L (ref 135–145)
TCO2: 20 mmol/L — ABNORMAL LOW (ref 22–32)

## 2020-03-24 LAB — BASIC METABOLIC PANEL
Anion gap: 11 (ref 5–15)
BUN: 7 mg/dL (ref 6–20)
CO2: 21 mmol/L — ABNORMAL LOW (ref 22–32)
Calcium: 8.6 mg/dL — ABNORMAL LOW (ref 8.9–10.3)
Chloride: 106 mmol/L (ref 98–111)
Creatinine, Ser: 0.87 mg/dL (ref 0.61–1.24)
GFR, Estimated: >60 mL/min (ref >60)
Glucose, Bld: 93 mg/dL (ref 70–99)
Potassium: 4 mmol/L (ref 3.5–5.1)
Sodium: 138 mmol/L (ref 135–145)

## 2020-03-24 LAB — RESP PANEL BY RT-PCR (FLU A&B, COVID) ARPGX2
Influenza A by PCR: NEGATIVE
Influenza B by PCR: NEGATIVE
SARS Coronavirus 2 by RT PCR: NEGATIVE

## 2020-03-24 LAB — SAMPLE TO BLOOD BANK

## 2020-03-24 LAB — CBG MONITORING, ED: Glucose-Capillary: 485 mg/dL — ABNORMAL HIGH (ref 70–99)

## 2020-03-24 LAB — ETHANOL: Alcohol, Ethyl (B): <10 mg/dL (ref <10)

## 2020-03-24 LAB — HEMOGLOBIN A1C
Hgb A1c MFr Bld: 6.2 % — ABNORMAL HIGH (ref 4.8–5.6)
Mean Plasma Glucose: 131.24 mg/dL

## 2020-03-24 LAB — MAGNESIUM: Magnesium: 2 mg/dL (ref 1.7–2.4)

## 2020-03-24 LAB — HIV ANTIBODY (ROUTINE TESTING W REFLEX): HIV Screen 4th Generation wRfx: NONREACTIVE

## 2020-03-24 MED ORDER — ENOXAPARIN SODIUM 40 MG/0.4ML ~~LOC~~ SOLN
40.0000 mg | SUBCUTANEOUS | Status: DC
  Administered 2020-03-24: 40 mg via SUBCUTANEOUS
  Filled 2020-03-24 (×2): qty 0.4

## 2020-03-24 MED ORDER — FAMOTIDINE 20 MG PO TABS
20.0000 mg | ORAL_TABLET | Freq: Two times a day (BID) | ORAL | Status: DC
  Administered 2020-03-25: 20 mg via ORAL
  Filled 2020-03-24 (×2): qty 1

## 2020-03-24 MED ORDER — LEVETIRACETAM IN NACL 1000 MG/100ML IV SOLN
1000.0000 mg | Freq: Two times a day (BID) | INTRAVENOUS | Status: AC
  Administered 2020-03-24 – 2020-03-25 (×3): 1000 mg via INTRAVENOUS
  Filled 2020-03-24 (×3): qty 100

## 2020-03-24 MED ORDER — ORAL CARE MOUTH RINSE
15.0000 mL | OROMUCOSAL | Status: DC
  Administered 2020-03-25 (×5): 15 mL via OROMUCOSAL

## 2020-03-24 MED ORDER — DEXTROSE 50 % IV SOLN
1.0000 | Freq: Once | INTRAVENOUS | Status: AC
  Administered 2020-03-24: 50 mL via INTRAVENOUS
  Filled 2020-03-24: qty 50

## 2020-03-24 MED ORDER — CHLORHEXIDINE GLUCONATE CLOTH 2 % EX PADS
6.0000 | MEDICATED_PAD | Freq: Every day | CUTANEOUS | Status: DC
  Administered 2020-03-24 – 2020-03-26 (×3): 6 via TOPICAL

## 2020-03-24 MED ORDER — CHLORHEXIDINE GLUCONATE 0.12% ORAL RINSE (MEDLINE KIT)
15.0000 mL | Freq: Two times a day (BID) | OROMUCOSAL | Status: DC
  Administered 2020-03-24 – 2020-03-25 (×2): 15 mL via OROMUCOSAL

## 2020-03-24 MED ORDER — SUCCINYLCHOLINE CHLORIDE 20 MG/ML IJ SOLN
INTRAMUSCULAR | Status: AC | PRN
  Administered 2020-03-24: 100 mg via INTRAVENOUS

## 2020-03-24 MED ORDER — MIDAZOLAM HCL 2 MG/2ML IJ SOLN
1.0000 mg | Freq: Once | INTRAMUSCULAR | Status: AC
  Administered 2020-03-24: 1 mg via INTRAVENOUS
  Filled 2020-03-24: qty 2

## 2020-03-24 MED ORDER — DOCUSATE SODIUM 100 MG PO CAPS: 100.0000 mg | ORAL_CAPSULE | Freq: Two times a day (BID) | ORAL | Status: DC | PRN

## 2020-03-24 MED ORDER — FENTANYL BOLUS VIA INFUSION
50.0000 ug | INTRAVENOUS | Status: DC | PRN
  Administered 2020-03-24 – 2020-03-25 (×4): 50 ug via INTRAVENOUS
  Filled 2020-03-24: qty 50

## 2020-03-24 MED ORDER — ETOMIDATE 2 MG/ML IV SOLN
INTRAVENOUS | Status: AC | PRN
  Administered 2020-03-24: 20 mg via INTRAVENOUS

## 2020-03-24 MED ORDER — INSULIN ASPART 100 UNIT/ML ~~LOC~~ SOLN: 0.0000 [IU] | SUBCUTANEOUS | Status: DC

## 2020-03-24 MED ORDER — LACTATED RINGERS IV SOLN: INTRAVENOUS | Status: DC

## 2020-03-24 MED ORDER — INSULIN ASPART 100 UNIT/ML IV SOLN
10.0000 [IU] | Freq: Once | INTRAVENOUS | Status: AC
  Administered 2020-03-24: 10 [IU] via INTRAVENOUS

## 2020-03-24 MED ORDER — PROPOFOL 1000 MG/100ML IV EMUL
5.0000 ug/kg/min | INTRAVENOUS | Status: DC
  Administered 2020-03-24: 10 ug/kg/min via INTRAVENOUS
  Administered 2020-03-25: 25 ug/kg/min via INTRAVENOUS
  Filled 2020-03-24 (×2): qty 100

## 2020-03-24 MED ORDER — ONDANSETRON HCL 4 MG/2ML IJ SOLN: 4.0000 mg | Freq: Four times a day (QID) | INTRAMUSCULAR | Status: DC | PRN

## 2020-03-24 MED ORDER — LACTATED RINGERS IV BOLUS
500.0000 mL | Freq: Once | INTRAVENOUS | Status: AC
  Administered 2020-03-24: 500 mL via INTRAVENOUS

## 2020-03-24 MED ORDER — LORAZEPAM 2 MG/ML IJ SOLN
2.0000 mg | INTRAMUSCULAR | Status: DC | PRN
  Filled 2020-03-24: qty 1

## 2020-03-24 MED ORDER — FENTANYL CITRATE (PF) 100 MCG/2ML IJ SOLN
50.0000 ug | Freq: Once | INTRAMUSCULAR | Status: AC
  Administered 2020-03-24: 50 ug via INTRAVENOUS

## 2020-03-24 MED ORDER — IOHEXOL 300 MG/ML  SOLN
100.0000 mL | Freq: Once | INTRAMUSCULAR | Status: AC | PRN
  Administered 2020-03-24: 100 mL via INTRAVENOUS

## 2020-03-24 MED ORDER — SODIUM ZIRCONIUM CYCLOSILICATE 10 G PO PACK: 10.0000 g | PACK | Freq: Once | ORAL | Status: DC

## 2020-03-24 MED ORDER — FENTANYL 2500MCG IN NS 250ML (10MCG/ML) PREMIX INFUSION
50.0000 ug/h | INTRAVENOUS | Status: DC
  Administered 2020-03-24: 100 ug/h via INTRAVENOUS
  Administered 2020-03-25: 200 ug/h via INTRAVENOUS
  Filled 2020-03-24 (×2): qty 250

## 2020-03-24 MED ORDER — POLYETHYLENE GLYCOL 3350 17 G PO PACK: 17.0000 g | PACK | Freq: Every day | ORAL | Status: DC | PRN
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
## 2020-03-24 NOTE — Progress Notes (Signed)
Patient belongings at bedside: Jeans, black leather belt, wallet, black nike shoes, socks, 1 Samsung cellphone.
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
## 2020-03-24 NOTE — Progress Notes (Signed)
PT transported to CT then 40M with no complications.
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
## 2020-03-24 NOTE — Progress Notes (Signed)
PCCM:  I called and spoke with patient's sister Rosanne Sack  Telephone number [place] 03/24/2020 8:34 PM
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
## 2020-03-25 ENCOUNTER — Inpatient Hospital Stay (HOSPITAL_COMMUNITY): Payer: Self-pay

## 2020-03-25 DIAGNOSIS — J988 Other specified respiratory disorders: Secondary | ICD-10-CM

## 2020-03-25 LAB — CBC
HCT: 34.5 % — ABNORMAL LOW (ref 39.0–52.0)
Hemoglobin: 11.8 g/dL — ABNORMAL LOW (ref 13.0–17.0)
MCH: 29.8 pg (ref 26.0–34.0)
MCHC: 34.2 g/dL (ref 30.0–36.0)
MCV: 87.1 fL (ref 80.0–100.0)
Platelets: 233 10*3/uL (ref 150–400)
RBC: 3.96 MIL/uL — ABNORMAL LOW (ref 4.22–5.81)
RDW: 14.2 % (ref 11.5–15.5)
WBC: 5.7 10*3/uL (ref 4.0–10.5)
nRBC: 0 % (ref 0.0–0.2)

## 2020-03-25 LAB — POCT I-STAT 7, (LYTES, BLD GAS, ICA,H+H)
Acid-Base Excess: 0 mmol/L (ref 0.0–2.0)
Acid-base deficit: 1 mmol/L (ref 0.0–2.0)
Bicarbonate: 23.5 mmol/L (ref 20.0–28.0)
Bicarbonate: 23.9 mmol/L (ref 20.0–28.0)
Calcium, Ion: 1.11 mmol/L — ABNORMAL LOW (ref 1.15–1.40)
Calcium, Ion: 1.19 mmol/L (ref 1.15–1.40)
HCT: 35 % — ABNORMAL LOW (ref 39.0–52.0)
HCT: 35 % — ABNORMAL LOW (ref 39.0–52.0)
Hemoglobin: 11.9 g/dL — ABNORMAL LOW (ref 13.0–17.0)
Hemoglobin: 11.9 g/dL — ABNORMAL LOW (ref 13.0–17.0)
O2 Saturation: 66 %
O2 Saturation: 96 %
Patient temperature: 98.6
Patient temperature: 98.6
Potassium: 3.6 mmol/L (ref 3.5–5.1)
Potassium: 3.6 mmol/L (ref 3.5–5.1)
Sodium: 138 mmol/L (ref 135–145)
Sodium: 139 mmol/L (ref 135–145)
TCO2: 25 mmol/L (ref 22–32)
TCO2: 25 mmol/L (ref 22–32)
pCO2 arterial: 35.5 mmHg (ref 32.0–48.0)
pCO2 arterial: 37.4 mmHg (ref 32.0–48.0)
pH, Arterial: 7.412 (ref 7.350–7.450)
pH, Arterial: 7.428 (ref 7.350–7.450)
pO2, Arterial: 34 mmHg — CL (ref 83.0–108.0)
pO2, Arterial: 80 mmHg — ABNORMAL LOW (ref 83.0–108.0)

## 2020-03-25 LAB — GLUCOSE, CAPILLARY
Glucose-Capillary: 78 mg/dL (ref 70–99)
Glucose-Capillary: 91 mg/dL (ref 70–99)
Glucose-Capillary: 100 mg/dL — ABNORMAL HIGH (ref 70–99)
Glucose-Capillary: 122 mg/dL — ABNORMAL HIGH (ref 70–99)

## 2020-03-25 LAB — BASIC METABOLIC PANEL
Anion gap: 9 (ref 5–15)
BUN: 7 mg/dL (ref 6–20)
CO2: 23 mmol/L (ref 22–32)
Calcium: 8.6 mg/dL — ABNORMAL LOW (ref 8.9–10.3)
Chloride: 105 mmol/L (ref 98–111)
Creatinine, Ser: 0.87 mg/dL (ref 0.61–1.24)
GFR, Estimated: >60 mL/min (ref >60)
Glucose, Bld: 97 mg/dL (ref 70–99)
Potassium: 4.6 mmol/L (ref 3.5–5.1)
Sodium: 137 mmol/L (ref 135–145)

## 2020-03-25 LAB — LACTIC ACID, PLASMA: Lactic Acid, Venous: 1 mmol/L (ref 0.5–1.9)

## 2020-03-25 LAB — PHOSPHORUS: Phosphorus: 2.7 mg/dL (ref 2.5–4.6)

## 2020-03-25 LAB — MRSA PCR SCREENING: MRSA by PCR: NEGATIVE

## 2020-03-25 LAB — MAGNESIUM: Magnesium: 2.1 mg/dL (ref 1.7–2.4)

## 2020-03-25 LAB — CK: Total CK: 307 U/L (ref 49–397)

## 2020-03-25 MED ORDER — LACTATED RINGERS IV BOLUS
500.0000 mL | Freq: Once | INTRAVENOUS | Status: AC
  Administered 2020-03-25: 500 mL via INTRAVENOUS

## 2020-03-25 MED ORDER — POTASSIUM CHLORIDE 10 MEQ/100ML IV SOLN: 10.0000 meq | INTRAVENOUS | Status: DC

## 2020-03-25 MED ORDER — POTASSIUM CHLORIDE 10 MEQ/100ML IV SOLN
10.0000 meq | INTRAVENOUS | Status: AC
  Administered 2020-03-25 (×3): 10 meq via INTRAVENOUS
  Filled 2020-03-25 (×3): qty 100

## 2020-03-25 MED ORDER — OXCARBAZEPINE 300 MG PO TABS
1200.0000 mg | ORAL_TABLET | Freq: Two times a day (BID) | ORAL | Status: DC
  Administered 2020-03-25 – 2020-03-26 (×3): 1200 mg via ORAL
  Filled 2020-03-25 (×3): qty 4

## 2020-03-25 MED ORDER — ACETAMINOPHEN 325 MG PO TABS
650.0000 mg | ORAL_TABLET | Freq: Four times a day (QID) | ORAL | Status: DC | PRN
  Administered 2020-03-25: 650 mg via ORAL
  Filled 2020-03-25: qty 2

## 2020-03-25 NOTE — Procedures (Addendum)
Patient Name: Brian Ward  MRN: [id number]  Epilepsy Attending: Lora Havens  Referring Physician/Provider: Dr. June Leap Date: 03/25/2020 Duration: 25 mins  Patient history: 45 year old male with history of epilepsy who presented with breakthrough seizure. EEG to evaluate for seizures.  Level of alertness: Awake, asleep  AEDs during EEG study: Propofol, Keppra  Technical aspects: This EEG study was done with scalp electrodes positioned according to the 10-20 International system of electrode placement. Electrical activity was acquired at a sampling rate of 500Hz  and reviewed with a high frequency filter of 70Hz  and a low frequency filter of 1Hz . EEG data were recorded continuously and digitally stored.   Description: The posterior dominant rhythm consists of 8 Hz activity of moderate voltage (25-35 uV) seen predominantly in posterior head regions, symmetric and reactive to eye opening and eye closing. Sleep was characterized by vertex waves, sleep spindles (12 to 14 Hz), maximal frontocentral region.  EEG showed intermittent generalized 3 to 6 Hz theta-delta slowing.   Hyperventilation and photic stimulation were not performed.     ABNORMALITY -Intermittent slow, generalized  IMPRESSION: This study is suggestive of mild diffuse encephalopathy, nonspecific etiology. No seizures or epileptiform discharges were seen throughout the recording.   Brian Ward

## 2020-03-25 NOTE — Progress Notes (Signed)
Transported patient to MRI while on the ventilator.

## 2020-03-25 NOTE — Progress Notes (Signed)
STAT Routine EEG completed. Result pending. Dr.Yadav notified
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
## 2020-03-25 NOTE — Procedures (Signed)
Extubation Procedure Note  Patient Details:   Name: Brian Ward DOB: [birth year] MRN: [id number]   Airway Documentation:   Patient extubated per orders and tolerating well. Patient had positive cuff leak prior to extubation. Placed on 4 lpm nasal cannula. Will continue to monitor. Vent end date: 03/25/20 Vent end time: 1006   Evaluation  O2 sats: stable throughout Complications: No apparent complications Patient did tolerate procedure well. Bilateral Breath Sounds: Clear,Diminished   Yes  Ander Purpura 03/25/2020, 10:07 AM

## 2020-03-25 NOTE — Progress Notes (Addendum)
eLink Physician-Brief Progress Note Patient Name: Brian Ward DOB: 06-17-1975 MRN: [id number]   Date of Service  03/25/2020  HPI/Events of Note  Patient has tan drainage from his left nares > right nares, in a slow trickle, raising concern for CSF rhinorrhea, he is afebrile currently. Low  Urine output.  eICU Interventions  Patient will need ENT and Neurology consultation this morning, he may also need an MRI of his nasopharynx but will defer to ENT / Neurology on this. Will order a 500 ml LR bolus.        Kerry Kass Andres Escandon 03/25/2020, 4:52 AM

## 2020-03-25 NOTE — Progress Notes (Addendum)
[facility] Progress Note Patient Name: Brian Ward DOB: [birth year] MRN: [id number]   Date of Service  03/25/2020  HPI/Events of Note  K+ 3.3  eICU Interventions  Modified adult electrolyte replacement protocol ordered. CPK ordered to screen for rhabdomyolysis.        Kerry Kass Zedekiah Hinderman 03/25/2020, 12:33 AM

## 2020-03-26 ENCOUNTER — Encounter: Payer: Self-pay | Admitting: Neurology

## 2020-03-26 ENCOUNTER — Other Ambulatory Visit (HOSPITAL_COMMUNITY): Payer: Self-pay | Admitting: Physician Assistant

## 2020-03-26 DIAGNOSIS — F17200 Nicotine dependence, unspecified, uncomplicated: Secondary | ICD-10-CM

## 2020-03-26 DIAGNOSIS — F1210 Cannabis abuse, uncomplicated: Secondary | ICD-10-CM

## 2020-03-26 DIAGNOSIS — T1490XA Injury, unspecified, initial encounter: Secondary | ICD-10-CM

## 2020-03-26 MED ORDER — OXCARBAZEPINE 600 MG PO TABS: 1200.0000 mg | ORAL_TABLET | Freq: Two times a day (BID) | ORAL | 3 refills | Status: DC

## 2020-03-26 MED FILL — OXcarbazepine 600 MG TABS: 600 | 30 days supply | Qty: 120 | Fill #0

## 2020-03-26 NOTE — Plan of Care (Signed)
Patient  discharge with Follow up visit and prescription information. IV removed skin C/D/I  at IV site.

## 2020-03-26 NOTE — Discharge Instructions (Signed)
Seizure, Adult A seizure is a sudden burst of abnormal electrical and chemical activity in the brain. Seizures usually last from 30 seconds to 2 minutes.  What are the causes? Common causes of this condition include:  Fever or infection.  Problems that affect the brain. These may include: ? A brain or head injury. ? Bleeding in the brain. ? A brain tumor.  Low levels of blood sugar or salt.  Kidney problems or liver problems.  Conditions that are passed from parent to child (are inherited).  Problems with a substance, such as: ? Having a reaction to a drug or a medicine. ? Stopping the use of a substance all of a sudden (withdrawal).  A stroke.  Disorders that affect how you develop. Sometimes, the cause may not be known.  What increases the risk?  Having someone in your family who has epilepsy. In this condition, seizures happen again and again over time. They have no clear cause.  Having had a tonic-clonic seizure before. This type of seizure causes you to: ? Tighten the muscles of the whole body. ? Lose consciousness.  Having had a head injury or strokes before.  Having had a lack of oxygen at birth. What are the signs or symptoms? There are many types of seizures. The symptoms vary depending on the type of seizure you have. Symptoms during a seizure  Shaking that you cannot control (convulsions) with fast, jerky movements of muscles.  Stiffness of the body.  Breathing problems.  Feeling mixed up (confused).  Staring or not responding to sound or touch.  Head nodding.  Eyes that blink, flutter, or move fast.  Drooling, grunting, or making clicking sounds with your mouth  Losing control of when you pee or poop. Symptoms before a seizure  Feeling afraid, nervous, or worried.  Feeling like you may vomit.  Feeling like: ? You are moving when you are not. ? Things around you are moving when they are not.  Feeling like you saw or heard something before  (dj vu).  Odd tastes or smells.  Changes in how you see. You may see flashing lights or spots. Symptoms after a seizure  Feeling confused.  Feeling sleepy.  Headache.  Sore muscles. How is this treated? If your seizure stops on its own, you will not need treatment. If your seizure lasts longer than 5 minutes, you will normally need treatment. Treatment may include:  Medicines given through an IV tube.  Avoiding things, such as medicines, that are known to cause your seizures.  Medicines to prevent seizures.  A device to prevent or control seizures.  Surgery.  A diet low in carbohydrates and high in fat (ketogenic diet). Follow these instructions at home: Medicines  Take over-the-counter and prescription medicines only as told by your doctor.  Avoid foods or drinks that may keep your medicine from working, such as alcohol. Activity  Follow instructions about driving, swimming, or doing things that would be dangerous if you had another seizure. Wait until your doctor says it is safe for you to do these things.  If you live in the [place] ask your local department of motor vehicles when you can drive.  Get a lot of rest. Teaching others  Teach friends and family what to do when you have a seizure. They should: ? Help you get down to the ground. ? Protect your head and body. ? Loosen any clothing around your neck. ? Turn you on your side. ? Know whether or not   you need emergency care. ? Stay with you until you are better.  Also, tell them what not to do if you have a seizure. Tell them: ? They should not hold you down. ? They should not put anything in your mouth.   General instructions  Avoid anything that gives you seizures.  Keep a seizure diary. Write down: ? What you remember about each seizure. ? What you think caused each seizure.  Keep all follow-up visits. Contact a doctor if:  You have another seizure or seizures. Call the doctor each time you  have a seizure.  The pattern of your seizures changes.  You keep having seizures with treatment.  You have symptoms of being sick or having an infection.  You are not able to take your medicine. Get help right away if:  You have any of these problems: ? A seizure that lasts longer than 5 minutes. ? Many seizures in a row and you do not feel better between seizures. ? A seizure that makes it harder to breathe. ? A seizure and you can no longer speak or use part of your body.  You do not wake up right after a seizure.  You get hurt during a seizure.  You feel confused or have pain right after a seizure. These symptoms may be an emergency. Get help right away. Call your local emergency services (911 in the [place]  Do not wait to see if the symptoms will go away.  Do not drive yourself to the hospital. Summary  A seizure is a sudden burst of abnormal electrical and chemical activity in the brain. Seizures normally last from 30 seconds to 2 minutes.  Causes of seizures include illness, injury to the head, low levels of blood sugar or salt, and certain conditions.  Most seizures will stop on their own in less than 5 minutes. Seizures that last longer than 5 minutes are a medical emergency and need treatment right away.  Many medicines are used to treat seizures. Take over-the-counter and prescription medicines only as told by your doctor. This information is not intended to replace advice given to you by your health care provider. Make sure you discuss any questions you have with your health care provider. Document Revised: 08/01/2019 Document Reviewed: 08/01/2019 Elsevier Patient Education  [employer].    - Continue Oxcarbazepine 1200 mg BID and stop prior Keppra per neuro recs. - No driving until cleared by neurology - did inform pt prior to d/c. - Follow up with neurology as an outpatient within the next week.
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
## 2020-03-26 NOTE — TOC Transition Note (Signed)
Transition of Care [facility]) - CM/SW Discharge Note   Patient Details  Name: Brian Ward MRN: [id number] Date of Birth: January 24, 1976  [facility] CM/SW Contact:  Bartholomew Crews, RN Phone Number: [phone] 03/26/2020, 8:58 AM   Clinical Narrative:     Spoke with patient on his mobile phone. Patient to transition home today. Discussed medication sent to [facility] pharmacy. Patient stated that this is also where he gets his Keppra. Discussed hospital follow up. Patient reports that his PCP is Dr. Delice Lesch who is also his neurologist. Patient living with family. He stated that his sister will be picking up from the hospital. He verbalized understanding that is likely not to be able to drive for a year or two. No TOC needs identified at this time.   Final next level of care: Home/Self Care Barriers to Discharge: No Barriers Identified   Patient Goals and CMS Choice Patient states their goals for this hospitalization and ongoing recovery are:: return home with family CMS Medicare.gov Compare Post Acute Care list provided to:: Patient Choice offered to / list presented to : NA  Discharge Placement                       Discharge Plan and Services                DME Arranged: N/A DME Agency: NA       HH Arranged: NA HH Agency: NA        Social Determinants of Health (SDOH) Interventions     Readmission Risk Interventions No flowsheet data found.

## 2020-04-20 ENCOUNTER — Telehealth: Payer: Self-pay | Admitting: Neurology

## 2020-04-20 NOTE — Telephone Encounter (Signed)
Pt c/o: depression Is this new? Yes.   Has pt seen PCP about this?  No. Has pt been on antidepressant previously?  No. Is patient suicidal/homicidal?  No. Is patient willing to be referred for counseling?  Yes.      Please advise, Pt states this has gone on for a while now and thought that it would go away. He has reached out to talk to anyone. Pt state it is getting worse.   Pt states he is unable to work for more then two weeks, Pay his bills. He is not use to not having his own, Working to making his own money.

## 2020-04-20 NOTE — Telephone Encounter (Signed)
Patient called in and said, "I need some help, I'm just not myself. I'm feeling depression and anxiety and I can't control it. I'm crying all the time and just not myself. I don't know what to do. I just want to work and provide for myself but I can't do that."  [place]

## 2020-04-20 NOTE — Telephone Encounter (Signed)
Pls give him information for [facility], [address] [place], [place] number is [phone]. [facility] also is a walk-in first come first serve. He can also call the 24-hour Behavioral health helpline [phone]. Thanks

## 2020-04-21 ENCOUNTER — Telehealth: Payer: Self-pay | Admitting: Neurology

## 2020-04-21 NOTE — Telephone Encounter (Signed)
Please return our call.

## 2020-04-21 NOTE — Telephone Encounter (Signed)
No answer at 1:57

## 2020-04-22 NOTE — Telephone Encounter (Signed)
Patient advised and number given.

## 2020-05-05 MED FILL — OXCARBAZEPINE 600 MG TABS: 600 | 30 days supply | Qty: 120 | Fill #1

## 2020-05-14 ENCOUNTER — Other Ambulatory Visit: Payer: Self-pay

## 2020-05-14 ENCOUNTER — Encounter (HOSPITAL_COMMUNITY): Payer: Self-pay | Admitting: Emergency Medicine

## 2020-05-14 ENCOUNTER — Emergency Department (HOSPITAL_COMMUNITY)
Admission: EM | Admit: 2020-05-14 | Discharge: 2020-05-14 | Disposition: A | Discharge status: Home / Self Care | Payer: Self-pay | Attending: Emergency Medicine | Admitting: Emergency Medicine

## 2020-05-14 DIAGNOSIS — F17290 Nicotine dependence, other tobacco product, uncomplicated: Secondary | ICD-10-CM | POA: Insufficient documentation

## 2020-05-14 DIAGNOSIS — R569 Unspecified convulsions: Secondary | ICD-10-CM

## 2020-05-14 DIAGNOSIS — G40909 Epilepsy, unspecified, not intractable, without status epilepticus: Secondary | ICD-10-CM | POA: Insufficient documentation

## 2020-05-14 LAB — CBC
HCT: 35.9 % — ABNORMAL LOW (ref 39.0–52.0)
Hemoglobin: 11.6 g/dL — ABNORMAL LOW (ref 13.0–17.0)
MCH: 30.5 pg (ref 26.0–34.0)
MCHC: 32.3 g/dL (ref 30.0–36.0)
MCV: 94.5 fL (ref 80.0–100.0)
Platelets: 220 10*3/uL (ref 150–400)
RBC: 3.8 MIL/uL — ABNORMAL LOW (ref 4.22–5.81)
RDW: 15.3 % (ref 11.5–15.5)
WBC: 3.6 10*3/uL — ABNORMAL LOW (ref 4.0–10.5)
nRBC: 0 % (ref 0.0–0.2)

## 2020-05-14 LAB — BASIC METABOLIC PANEL
Anion gap: 6 (ref 5–15)
BUN: 7 mg/dL (ref 6–20)
CO2: 26 mmol/L (ref 22–32)
Calcium: 8.5 mg/dL — ABNORMAL LOW (ref 8.9–10.3)
Chloride: 107 mmol/L (ref 98–111)
Creatinine, Ser: 0.76 mg/dL (ref 0.61–1.24)
GFR, Estimated: >60 mL/min (ref >60)
Glucose, Bld: 95 mg/dL (ref 70–99)
Potassium: 3.8 mmol/L (ref 3.5–5.1)
Sodium: 139 mmol/L (ref 135–145)

## 2020-05-14 MED ORDER — DIVALPROEX SODIUM 250 MG PO DR TAB
1000.0000 mg | DELAYED_RELEASE_TABLET | Freq: Once | ORAL | Status: AC
  Administered 2020-05-14: 1000 mg via ORAL
  Filled 2020-05-14: qty 4

## 2020-05-14 MED ORDER — DIVALPROEX SODIUM 500 MG PO DR TAB
500.0000 mg | DELAYED_RELEASE_TABLET | Freq: Two times a day (BID) | ORAL | 0 refills | Status: DC
  Filled 2020-05-14: qty 60, 30d supply, fill #0

## 2020-05-14 NOTE — ED Notes (Signed)
Vital signs stable. 

## 2020-05-14 NOTE — ED Notes (Signed)
ED Provider at bedside. 

## 2020-05-14 NOTE — ED Notes (Signed)
Patient discharge instructions reviewed with the patient. The patient verbalized understanding of instructions. Patient discharged. 

## 2020-05-14 NOTE — ED Triage Notes (Signed)
Patient BIB [facility] from home. Complaint of seizure today, patient also reports same on Tuesday and Wednesday. Patient A&Ox4 upon arrival. VSS.

## 2020-05-14 NOTE — ED Notes (Signed)
Patient is resting comfortably. 

## 2020-05-14 NOTE — Discharge Instructions (Signed)
Please read and follow all provided instructions.  Your diagnoses today include:  1. Seizures ([place])     Tests performed today include: Blood cell counts (white, red, and platelets) Electrolytes  Kidney function test  Vital signs. See below for your results today.   Medications prescribed:   Depakote (valproate) - additional medication for seizures, please take as prescribed  Take any prescribed medications only as directed.  Home care instructions:  Follow any educational materials contained in this packet.  Please continue the oxcarbazepine medicine you are already taking for your seizures.  Please add the new medication, Depakote also known as valproate.  Standard seizure precautions: Per [facility] statutes, patients with seizures are not allowed to drive until they have been seizure-free for six months. Use caution when using heavy equipment or power tools. Avoid working on ladders or at heights. Take showers instead of baths. Ensure the water temperature is not too high on the home water heater. Do not go swimming alone. When caring for infants or small children, sit down when holding, feeding, or changing them to minimize risk of injury to the child in the event you have a seizure. To reduce risk of seizures, maintain good sleep hygiene avoid alcohol and illicit drug use, take all anti-seizure medications as prescribed.   Follow-up instructions: Please follow-up with your primary care provider in the next 3 days for further evaluation of your symptoms.   Return instructions:   Please return to the Emergency Department if you experience worsening symptoms.   Please return with any additional seizures.  Please return if you have any other emergent concerns.  Additional Information:  Your vital signs today were: BP 117/83 (BP Location: Right Arm)   Pulse 62   Temp 98.2 F (36.8 C) (Oral)   Resp 15   Ht 5\' 9"  (1.753 m)   Wt 88.9 kg   SpO2 100%   BMI 28.94  kg/m  If your blood pressure (BP) was elevated above 135/85 this visit, please have this repeated by your doctor within one month. --------------
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
## 2020-05-20 ENCOUNTER — Other Ambulatory Visit: Payer: Self-pay

## 2020-05-20 ENCOUNTER — Telehealth (INDEPENDENT_AMBULATORY_CARE_PROVIDER_SITE_OTHER): Payer: Self-pay | Admitting: Neurology

## 2020-05-20 ENCOUNTER — Encounter: Payer: Self-pay | Admitting: Neurology

## 2020-05-20 VITALS — Ht 69.0 in | Wt 198.0 lb

## 2020-05-20 DIAGNOSIS — G40009 Localization-related (focal) (partial) idiopathic epilepsy and epileptic syndromes with seizures of localized onset, not intractable, without status epilepticus: Secondary | ICD-10-CM

## 2020-05-20 MED ORDER — DIVALPROEX SODIUM 500 MG PO DR TAB
DELAYED_RELEASE_TABLET | ORAL | 11 refills | Status: DC
  Filled 2020-05-20: qty 60, 30d supply, fill #0
  Filled 2020-06-04: qty 60, 33d supply, fill #0

## 2020-05-20 MED ORDER — OXCARBAZEPINE 600 MG PO TABS
ORAL_TABLET | ORAL | 11 refills | Status: DC
  Filled 2020-05-20 – 2020-06-04 (×2): qty 120, 30d supply, fill #0
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
## 2020-05-27 ENCOUNTER — Other Ambulatory Visit: Payer: Self-pay

## 2020-05-28 ENCOUNTER — Other Ambulatory Visit: Payer: Self-pay

## 2020-06-04 ENCOUNTER — Other Ambulatory Visit: Payer: Self-pay

## 2020-06-29 ENCOUNTER — Encounter (HOSPITAL_COMMUNITY): Payer: Self-pay | Admitting: Emergency Medicine

## 2020-06-29 ENCOUNTER — Emergency Department (HOSPITAL_COMMUNITY)
Admission: EM | Admit: 2020-06-29 | Discharge: 2020-06-29 | Disposition: A | Discharge status: Home / Self Care | Payer: Self-pay | Attending: Emergency Medicine | Admitting: Emergency Medicine

## 2020-06-29 DIAGNOSIS — Z5321 Procedure and treatment not carried out due to patient leaving prior to being seen by health care provider: Secondary | ICD-10-CM | POA: Insufficient documentation

## 2020-06-29 DIAGNOSIS — R569 Unspecified convulsions: Secondary | ICD-10-CM | POA: Insufficient documentation

## 2020-06-29 LAB — CBG MONITORING, ED: Glucose-Capillary: 133 mg/dL — ABNORMAL HIGH (ref 70–99)

## 2020-06-29 NOTE — ED Triage Notes (Signed)
Pt reports seizures last night and this morning. Pt states compliant with seizure medication. Takes trilepal. Denies any injury from seizures but here because he is trying to work and needs better control of his seizures.

## 2020-06-29 NOTE — ED Notes (Signed)
Pt not answering in lobby

## 2020-07-01 ENCOUNTER — Emergency Department (HOSPITAL_COMMUNITY)
Admission: EM | Admit: 2020-07-01 | Discharge: 2020-07-01 | Disposition: A | Discharge status: Home / Self Care | Payer: Self-pay | Attending: Emergency Medicine | Admitting: Emergency Medicine

## 2020-07-01 ENCOUNTER — Emergency Department (HOSPITAL_COMMUNITY): Payer: Self-pay

## 2020-07-01 ENCOUNTER — Other Ambulatory Visit: Payer: Self-pay

## 2020-07-01 ENCOUNTER — Encounter (HOSPITAL_COMMUNITY): Payer: Self-pay | Admitting: *Deleted

## 2020-07-01 DIAGNOSIS — K0889 Other specified disorders of teeth and supporting structures: Secondary | ICD-10-CM | POA: Insufficient documentation

## 2020-07-01 DIAGNOSIS — G932 Benign intracranial hypertension: Secondary | ICD-10-CM | POA: Insufficient documentation

## 2020-07-01 DIAGNOSIS — Z79899 Other long term (current) drug therapy: Secondary | ICD-10-CM | POA: Insufficient documentation

## 2020-07-01 DIAGNOSIS — G40909 Epilepsy, unspecified, not intractable, without status epilepticus: Secondary | ICD-10-CM | POA: Insufficient documentation

## 2020-07-01 DIAGNOSIS — F17290 Nicotine dependence, other tobacco product, uncomplicated: Secondary | ICD-10-CM | POA: Insufficient documentation

## 2020-07-01 LAB — CBC WITH DIFFERENTIAL/PLATELET
Abs Immature Granulocytes: 0.01 10*3/uL (ref 0.00–0.07)
Basophils Absolute: 0 10*3/uL (ref 0.0–0.1)
Basophils Relative: 0 %
Eosinophils Absolute: 0.1 10*3/uL (ref 0.0–0.5)
Eosinophils Relative: 3 %
HCT: 39.3 % (ref 39.0–52.0)
Hemoglobin: 12.7 g/dL — ABNORMAL LOW (ref 13.0–17.0)
Immature Granulocytes: 0 %
Lymphocytes Relative: 36 %
Lymphs Abs: 1.6 10*3/uL (ref 0.7–4.0)
MCH: 30.3 pg (ref 26.0–34.0)
MCHC: 32.3 g/dL (ref 30.0–36.0)
MCV: 93.8 fL (ref 80.0–100.0)
Monocytes Absolute: 0.5 10*3/uL (ref 0.1–1.0)
Monocytes Relative: 10 %
Neutro Abs: 2.3 10*3/uL (ref 1.7–7.7)
Neutrophils Relative %: 51 %
Platelets: 240 10*3/uL (ref 150–400)
RBC: 4.19 MIL/uL — ABNORMAL LOW (ref 4.22–5.81)
RDW: 14.1 % (ref 11.5–15.5)
WBC: 4.6 10*3/uL (ref 4.0–10.5)
nRBC: 0 % (ref 0.0–0.2)

## 2020-07-01 LAB — BASIC METABOLIC PANEL
Anion gap: 6 (ref 5–15)
BUN: 11 mg/dL (ref 6–20)
CO2: 27 mmol/L (ref 22–32)
Calcium: 9.1 mg/dL (ref 8.9–10.3)
Chloride: 103 mmol/L (ref 98–111)
Creatinine, Ser: 1.01 mg/dL (ref 0.61–1.24)
GFR, Estimated: >60 mL/min (ref >60)
Glucose, Bld: 99 mg/dL (ref 70–99)
Potassium: 4.4 mmol/L (ref 3.5–5.1)
Sodium: 136 mmol/L (ref 135–145)

## 2020-07-01 LAB — RAPID URINE DRUG SCREEN, HOSP PERFORMED
Amphetamines: NOT DETECTED
Barbiturates: NOT DETECTED
Benzodiazepines: NOT DETECTED
Cocaine: NOT DETECTED
Opiates: NOT DETECTED
Tetrahydrocannabinol: POSITIVE — AB

## 2020-07-01 MED ORDER — LORAZEPAM 2 MG/ML IJ SOLN
1.0000 mg | Freq: Once | INTRAMUSCULAR | Status: AC
  Administered 2020-07-01: 1 mg via INTRAVENOUS
  Filled 2020-07-01: qty 1

## 2020-07-01 MED ORDER — GADOBUTROL 1 MMOL/ML IV SOLN
8.0000 mL | Freq: Once | INTRAVENOUS | Status: AC | PRN
  Administered 2020-07-01: 8 mL via INTRAVENOUS

## 2020-07-01 MED ORDER — CARBAMIDE PEROXIDE 6.5 % OT SOLN
5.0000 [drp] | Freq: Once | OTIC | Status: AC
  Administered 2020-07-01: 5 [drp] via OTIC
  Filled 2020-07-01: qty 15

## 2020-07-01 MED ORDER — PENICILLIN V POTASSIUM 500 MG PO TABS
500.0000 mg | ORAL_TABLET | Freq: Four times a day (QID) | ORAL | 0 refills | Status: AC
  Filled 2020-07-01: qty 28, 7d supply, fill #0

## 2020-07-01 NOTE — ED Notes (Signed)
Pt to MRI

## 2020-07-01 NOTE — ED Provider Notes (Signed)
Emergency Medicine Provider Triage Evaluation Note  Brian Ward , a 45 y.o. male  was evaluated in triage.  Pt complains of right ear pain where he states he has a "tumor."  Also complains of cracked teeth in his mouth.  Also complains of 3 seizures this week despite taking his usual seizure medications.  Review of Systems  Positive: Right ear pain, dental pain, headache, seizure Negative: No chest pain, no shortness of breath, no focal weakness or paresthesia.  Physical Exam  BP 138/83 (BP Location: Left Arm)   Pulse 70   Temp 98.5 F (36.9 C) (Oral)   Resp 16   SpO2 100%  Gen:   Awake, no distress   Resp:  Normal effort  MSK:   Moves extremities without difficulty  Other:  Bilateral external auditory canals with cerumen impactions. He is lucid.  No focal abnormalities.  Medical Decision Making  Medically screening exam initiated at 10:23 AM.  Appropriate orders placed.  Brian Ward was informed that the remainder of the evaluation will be completed by another provider, this initial triage assessment does not replace that evaluation, and the importance of remaining in the ED until their evaluation is complete.  Nonspecific symptoms, multiple body systems, requiring ED evaluation.   Brian Bo, MD 07/01/20 1037
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
## 2020-07-01 NOTE — Discharge Instructions (Addendum)
Call your neurologist tomorrow for follow-up based on today's MRI showing intracranial hypertension.  Also call the on-call dentist Dr. Link Snuffer phone number is [phone].  Take the antibiotic penicillin as directed for the tooth pain.  Continue take your antiseizure medicines as directed.  Return for any new or worse symptoms.  Work note provided.

## 2020-07-01 NOTE — ED Triage Notes (Signed)
Pt reports he is having more frequent seizures. He has hx of seizures. He says he went to [facility] and was told he may have a tumor and to follow up with neurology. He also has right lower dental pain.

## 2020-07-02 ENCOUNTER — Other Ambulatory Visit: Payer: Self-pay

## 2020-07-02 ENCOUNTER — Encounter: Payer: Self-pay | Admitting: Physician Assistant

## 2020-07-02 ENCOUNTER — Ambulatory Visit (INDEPENDENT_AMBULATORY_CARE_PROVIDER_SITE_OTHER): Payer: Self-pay | Admitting: Physician Assistant

## 2020-07-02 ENCOUNTER — Encounter: Payer: Self-pay | Admitting: Neurology

## 2020-07-02 VITALS — BP 123/77 | HR 64 | Ht 68.5 in | Wt 181.8 lb

## 2020-07-02 DIAGNOSIS — R569 Unspecified convulsions: Secondary | ICD-10-CM

## 2020-07-02 MED ORDER — DIVALPROEX SODIUM 500 MG PO DR TAB
DELAYED_RELEASE_TABLET | ORAL | 11 refills | Status: DC
  Filled 2020-07-02: qty 90, 30d supply, fill #0
  Filled 2020-08-20: qty 90, 30d supply, fill #1

## 2020-07-02 MED ORDER — OXCARBAZEPINE 600 MG PO TABS
ORAL_TABLET | ORAL | 11 refills | Status: DC
  Filled 2020-07-02: qty 120, 30d supply, fill #0
  Filled 2020-08-20: qty 120, 30d supply, fill #1
  Filled 2020-10-29: qty 120, 30d supply, fill #2
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
## 2020-07-02 NOTE — Patient Instructions (Addendum)
It was a pleasure to see you in the office today  Recommendations: 1. Increase Depakote 500mg : take 1 tablet in AM, 2 tablets in PM  2. Continue oxcarbazepine 600mg : Take 2 tablets twice a day  3. Contact  [facility] to help you with medications, support, and guidance for avenues for work Local: [phone] Email: efnc@[facility] .edu Helpline: [phone]) 546-5035  4. Schedule spinal tap  5. Referral will be sent to ENT  6. Follow-up as scheduled in August, call for any changes.    Seizure Precautions:  1. If medication has been prescribed for you to prevent seizures, take it exactly as directed.  Do not stop taking the medicine without talking to your doctor first, even if you have not had a seizure in a long time.   2. Avoid activities in which a seizure would cause danger to yourself or to others.  Don't operate dangerous machinery, swim alone, or climb in high or dangerous places, such as on ladders, roofs, or girders.  Do not drive unless your doctor says you may.  3. If you have any warning that you may have a seizure, lay down in a safe place where you can't hurt yourself.    4.  No driving for 6 months from last seizure, as per [facility].   Please refer to the following link on the [place] website for more information: http://www.epilepsyfoundation.org/answerplace/Social/driving/drivingu.cfm   5.  Maintain good sleep hygiene.  6.  Notify your neurologist if you are planning pregnancy or if you become pregnant.  7.  Contact your doctor if you have any problems that may be related to the medicine you are taking.  8.  Call 911 and bring the patient back to the ED if:        A.  The seizure lasts longer than 5 minutes.       B.  The patient doesn't awaken shortly after the seizure  C.  The patient has new problems such as difficulty seeing, speaking or moving  D.  The patient was injured during the seizure  E.  The patient  has a temperature over 102 F (39C)  F.  The patient vomited and now is having trouble breathing

## 2020-07-06 ENCOUNTER — Other Ambulatory Visit: Payer: Self-pay

## 2020-07-28 ENCOUNTER — Ambulatory Visit (HOSPITAL_COMMUNITY)
Admission: EM | Admit: 2020-07-28 | Discharge: 2020-07-28 | Disposition: A | Discharge status: Home / Self Care | Payer: No Payment, Other | Attending: Family | Admitting: Family

## 2020-07-28 ENCOUNTER — Encounter (HOSPITAL_COMMUNITY): Payer: Self-pay | Admitting: Registered Nurse

## 2020-07-28 ENCOUNTER — Other Ambulatory Visit: Payer: Self-pay

## 2020-07-28 ENCOUNTER — Telehealth (HOSPITAL_COMMUNITY): Payer: Self-pay | Admitting: Clinical

## 2020-07-28 DIAGNOSIS — F332 Major depressive disorder, recurrent severe without psychotic features: Secondary | ICD-10-CM | POA: Insufficient documentation

## 2020-07-28 DIAGNOSIS — Z79899 Other long term (current) drug therapy: Secondary | ICD-10-CM | POA: Diagnosis not present

## 2020-07-28 DIAGNOSIS — Z560 Unemployment, unspecified: Secondary | ICD-10-CM | POA: Diagnosis not present

## 2020-07-28 DIAGNOSIS — G40909 Epilepsy, unspecified, not intractable, without status epilepticus: Secondary | ICD-10-CM | POA: Diagnosis not present

## 2020-07-28 NOTE — Discharge Summary (Signed)
Brian Ward to be D/C'd Home per NP order. Discussed with the patient and all questions fully answered. An After Visit Summary was printed and given to the patient. Patient escorted out and D/C home via private auto.  Clois Dupes  07/28/2020 4:08 PM
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
## 2020-07-28 NOTE — [facility] Counselor (Signed)
This counselor received a phone call from Brian Ward stating that he is currently considering taking an intentional overdose in suicide attempt. He states he is staring at his pills at this moment. He is tearful. He agrees to give me his address and phone number so police can be dispatched to his residence.  This counselor contacted 911. Per dispatcher, police en route to patient's residence.

## 2020-07-28 NOTE — Discharge Instructions (Addendum)

## 2020-08-20 ENCOUNTER — Other Ambulatory Visit: Payer: Self-pay

## 2020-08-20 ENCOUNTER — Telehealth: Payer: Self-pay | Admitting: Nurse Practitioner

## 2020-08-20 NOTE — Telephone Encounter (Signed)
Patient has refills on file and needs to contact pharmacy. Spoke with pharmacy and they will get scripts ready.

## 2020-08-20 NOTE — Telephone Encounter (Signed)
Medication Refill - Medication:  oxcarbazepine (TRILEPTAL) 600 MG tablet divalproex (DEPAKOTE) 500 MG DR tablet   Has the patient contacted their pharmacy? No.  Preferred Pharmacy (with phone number or street name):  [facility] [place]  Phone:  [phone] Fax:  [phone]   Agent: Please be advised that RX refills may take up to 3 business days. We ask that you follow-up with your pharmacy.

## 2020-08-26 ENCOUNTER — Other Ambulatory Visit: Payer: Self-pay

## 2020-08-27 ENCOUNTER — Other Ambulatory Visit: Payer: Self-pay

## 2020-09-06 ENCOUNTER — Emergency Department (HOSPITAL_COMMUNITY)
Admission: EM | Admit: 2020-09-06 | Discharge: 2020-09-06 | Disposition: A | Discharge status: Home / Self Care | Payer: Self-pay | Attending: Emergency Medicine | Admitting: Emergency Medicine

## 2020-09-06 ENCOUNTER — Other Ambulatory Visit: Payer: Self-pay

## 2020-09-06 ENCOUNTER — Emergency Department (HOSPITAL_COMMUNITY): Payer: Self-pay

## 2020-09-06 DIAGNOSIS — S0083XA Contusion of other part of head, initial encounter: Secondary | ICD-10-CM | POA: Insufficient documentation

## 2020-09-06 DIAGNOSIS — W1839XA Other fall on same level, initial encounter: Secondary | ICD-10-CM | POA: Insufficient documentation

## 2020-09-06 DIAGNOSIS — G40909 Epilepsy, unspecified, not intractable, without status epilepticus: Secondary | ICD-10-CM | POA: Insufficient documentation

## 2020-09-06 DIAGNOSIS — Y92002 Bathroom of unspecified non-institutional (private) residence single-family (private) house as the place of occurrence of the external cause: Secondary | ICD-10-CM | POA: Insufficient documentation

## 2020-09-06 DIAGNOSIS — F17210 Nicotine dependence, cigarettes, uncomplicated: Secondary | ICD-10-CM | POA: Insufficient documentation

## 2020-09-06 DIAGNOSIS — R569 Unspecified convulsions: Secondary | ICD-10-CM

## 2020-09-06 DIAGNOSIS — Z79899 Other long term (current) drug therapy: Secondary | ICD-10-CM | POA: Insufficient documentation

## 2020-09-06 LAB — COMPREHENSIVE METABOLIC PANEL
ALT: 13 U/L (ref 0–44)
AST: 19 U/L (ref 15–41)
Albumin: 4.3 g/dL (ref 3.5–5.0)
Alkaline Phosphatase: 75 U/L (ref 38–126)
Anion gap: 8 (ref 5–15)
BUN: 5 mg/dL — ABNORMAL LOW (ref 6–20)
CO2: 24 mmol/L (ref 22–32)
Calcium: 9.3 mg/dL (ref 8.9–10.3)
Chloride: 106 mmol/L (ref 98–111)
Creatinine, Ser: 1.09 mg/dL (ref 0.61–1.24)
GFR, Estimated: >60 mL/min (ref >60)
Glucose, Bld: 87 mg/dL (ref 70–99)
Potassium: 4.3 mmol/L (ref 3.5–5.1)
Sodium: 138 mmol/L (ref 135–145)
Total Bilirubin: 0.8 mg/dL (ref 0.3–1.2)
Total Protein: 7.3 g/dL (ref 6.5–8.1)

## 2020-09-06 LAB — MAGNESIUM: Magnesium: 2.4 mg/dL (ref 1.7–2.4)

## 2020-09-06 LAB — CBC WITH DIFFERENTIAL/PLATELET
Abs Immature Granulocytes: 0.04 10*3/uL (ref 0.00–0.07)
Basophils Absolute: 0 10*3/uL (ref 0.0–0.1)
Basophils Relative: 0 %
Eosinophils Absolute: 0 10*3/uL (ref 0.0–0.5)
Eosinophils Relative: 0 %
HCT: 43.1 % (ref 39.0–52.0)
Hemoglobin: 13.9 g/dL (ref 13.0–17.0)
Immature Granulocytes: 0 %
Lymphocytes Relative: 10 %
Lymphs Abs: 0.9 10*3/uL (ref 0.7–4.0)
MCH: 30.3 pg (ref 26.0–34.0)
MCHC: 32.3 g/dL (ref 30.0–36.0)
MCV: 93.9 fL (ref 80.0–100.0)
Monocytes Absolute: 0.8 10*3/uL (ref 0.1–1.0)
Monocytes Relative: 8 %
Neutro Abs: 8 10*3/uL — ABNORMAL HIGH (ref 1.7–7.7)
Neutrophils Relative %: 82 %
Platelets: 289 10*3/uL (ref 150–400)
RBC: 4.59 MIL/uL (ref 4.22–5.81)
RDW: 14 % (ref 11.5–15.5)
WBC: 9.8 10*3/uL (ref 4.0–10.5)
nRBC: 0 % (ref 0.0–0.2)

## 2020-09-06 LAB — CBG MONITORING, ED: Glucose-Capillary: 128 mg/dL — ABNORMAL HIGH (ref 70–99)

## 2020-09-06 MED ORDER — KETOROLAC TROMETHAMINE 15 MG/ML IJ SOLN
15.0000 mg | Freq: Once | INTRAMUSCULAR | Status: AC
  Administered 2020-09-06: 15 mg via INTRAVENOUS
  Filled 2020-09-06: qty 1

## 2020-09-06 MED ORDER — DIVALPROEX SODIUM 250 MG PO DR TAB
500.0000 mg | DELAYED_RELEASE_TABLET | Freq: Once | ORAL | Status: AC
  Administered 2020-09-06: 500 mg via ORAL
  Filled 2020-09-06: qty 2

## 2020-09-06 MED ORDER — DIVALPROEX SODIUM 500 MG PO DR TAB
500.0000 mg | DELAYED_RELEASE_TABLET | Freq: Two times a day (BID) | ORAL | 0 refills | Status: DC
  Filled 2020-09-06: qty 60, 30d supply, fill #0

## 2020-09-06 NOTE — ED Notes (Signed)
Pt transported to CT ?

## 2020-09-06 NOTE — ED Notes (Signed)
Pt d/c home per MD order. Discharge summary reviewed with pt, pt verbalizes understanding. No s/s of acute distress noted at discharge. Reports g/f is d/c ride home.

## 2020-09-06 NOTE — Discharge Instructions (Addendum)
That she can bePlease take Trileptal and the Depakote.  The Depakote is at a lower dose than you were taking before, so hopefully you will tolerate it better.  You should take it twice a day.  Please call your neurologist tomorrow seen in clinic, he should see them sooner than a month.  It is important that you take your medications as prescribed.
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
## 2020-09-06 NOTE — ED Triage Notes (Signed)
Pt to ED via EMS from [facility] which pt resides c/o unwitnessed seizure. Pt has hx epilepsy. Pt girlfriend at work called pt around 0730 am, pt told her he thought he had a seizure at some point prior. Pt gf did not hear from pt so came to the home and pt was laying in the floor post ictal. On EMS arrival pt post ictal. With time pt more oriented able to answer questions appropriately, but drowsy. No medications given by EMS. Per EMS hematoma noted to right back head, no bleeding. Last VS: 144/101, hr 74, 16RR, 97%ra, cbg 194.

## 2020-09-06 NOTE — Progress Notes (Signed)
Arrived at bedside after orders entered, MD had placed PIV, IV team services not needed

## 2020-09-13 ENCOUNTER — Other Ambulatory Visit: Payer: Self-pay

## 2020-10-29 ENCOUNTER — Other Ambulatory Visit: Payer: Self-pay

## 2020-11-03 ENCOUNTER — Other Ambulatory Visit: Payer: Self-pay

## 2020-11-08 ENCOUNTER — Other Ambulatory Visit: Payer: Self-pay

## 2020-12-12 ENCOUNTER — Emergency Department (HOSPITAL_COMMUNITY)
Admission: EM | Admit: 2020-12-12 | Discharge: 2020-12-13 | Disposition: A | Discharge status: Home / Self Care | Payer: Self-pay | Attending: Emergency Medicine | Admitting: Emergency Medicine

## 2020-12-12 ENCOUNTER — Other Ambulatory Visit: Payer: Self-pay

## 2020-12-12 ENCOUNTER — Encounter (HOSPITAL_COMMUNITY): Payer: Self-pay | Admitting: Emergency Medicine

## 2020-12-12 ENCOUNTER — Emergency Department (HOSPITAL_COMMUNITY): Payer: Self-pay

## 2020-12-12 DIAGNOSIS — G40909 Epilepsy, unspecified, not intractable, without status epilepticus: Secondary | ICD-10-CM | POA: Insufficient documentation

## 2020-12-12 DIAGNOSIS — F17210 Nicotine dependence, cigarettes, uncomplicated: Secondary | ICD-10-CM | POA: Insufficient documentation

## 2020-12-12 DIAGNOSIS — R569 Unspecified convulsions: Secondary | ICD-10-CM

## 2020-12-12 DIAGNOSIS — Z79899 Other long term (current) drug therapy: Secondary | ICD-10-CM | POA: Insufficient documentation

## 2020-12-12 LAB — CBC WITH DIFFERENTIAL/PLATELET
Abs Immature Granulocytes: 0.03 10*3/uL (ref 0.00–0.07)
Basophils Absolute: 0 10*3/uL (ref 0.0–0.1)
Basophils Relative: 0 %
Eosinophils Absolute: 0.1 10*3/uL (ref 0.0–0.5)
Eosinophils Relative: 1 %
HCT: 37.9 % — ABNORMAL LOW (ref 39.0–52.0)
Hemoglobin: 12.8 g/dL — ABNORMAL LOW (ref 13.0–17.0)
Immature Granulocytes: 0 %
Lymphocytes Relative: 21 %
Lymphs Abs: 1.6 10*3/uL (ref 0.7–4.0)
MCH: 30.5 pg (ref 26.0–34.0)
MCHC: 33.8 g/dL (ref 30.0–36.0)
MCV: 90.2 fL (ref 80.0–100.0)
Monocytes Absolute: 0.7 10*3/uL (ref 0.1–1.0)
Monocytes Relative: 9 %
Neutro Abs: 5.2 10*3/uL (ref 1.7–7.7)
Neutrophils Relative %: 69 %
Platelets: 221 10*3/uL (ref 150–400)
RBC: 4.2 MIL/uL — ABNORMAL LOW (ref 4.22–5.81)
RDW: 13.6 % (ref 11.5–15.5)
WBC: 7.6 10*3/uL (ref 4.0–10.5)
nRBC: 0 % (ref 0.0–0.2)

## 2020-12-12 LAB — COMPREHENSIVE METABOLIC PANEL
ALT: 15 U/L (ref 0–44)
AST: 16 U/L (ref 15–41)
Albumin: 4.3 g/dL (ref 3.5–5.0)
Alkaline Phosphatase: 87 U/L (ref 38–126)
Anion gap: 8 (ref 5–15)
BUN: 13 mg/dL (ref 6–20)
CO2: 25 mmol/L (ref 22–32)
Calcium: 9.1 mg/dL (ref 8.9–10.3)
Chloride: 104 mmol/L (ref 98–111)
Creatinine, Ser: 0.92 mg/dL (ref 0.61–1.24)
GFR, Estimated: >60 mL/min (ref >60)
Glucose, Bld: 124 mg/dL — ABNORMAL HIGH (ref 70–99)
Potassium: 3.9 mmol/L (ref 3.5–5.1)
Sodium: 137 mmol/L (ref 135–145)
Total Bilirubin: 0.6 mg/dL (ref 0.3–1.2)
Total Protein: 7 g/dL (ref 6.5–8.1)

## 2020-12-12 LAB — CBG MONITORING, ED: Glucose-Capillary: 145 mg/dL — ABNORMAL HIGH (ref 70–99)

## 2020-12-12 MED ORDER — OXCARBAZEPINE 300 MG PO TABS
600.0000 mg | ORAL_TABLET | Freq: Two times a day (BID) | ORAL | Status: DC
  Administered 2020-12-12: 600 mg via ORAL
  Filled 2020-12-12: qty 2

## 2020-12-12 MED ORDER — OXCARBAZEPINE 300 MG PO TABS
600.0000 mg | ORAL_TABLET | Freq: Once | ORAL | Status: AC
  Administered 2020-12-12: 600 mg via ORAL
  Filled 2020-12-12: qty 2

## 2020-12-12 MED ORDER — ACETAMINOPHEN 500 MG PO TABS
1000.0000 mg | ORAL_TABLET | Freq: Once | ORAL | Status: AC
  Administered 2020-12-12: 1000 mg via ORAL
  Filled 2020-12-12: qty 2
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
## 2020-12-12 NOTE — Discharge Instructions (Signed)
You have been seen and discharged from the emergency department.  Take your seizure medication as directed.  Follow-up with your neurologist, call the office tomorrow for reevaluation and further care. Take home medications as prescribed. If you have any worsening symptoms or further concerns for your health please return to an emergency department for further evaluation.

## 2020-12-12 NOTE — ED Triage Notes (Addendum)
Pt arrived from home via EMS. Pt has hx of seizures and had a witnessed seizure. Pt hit his nose on the washer when he fell. Pt is on keppra and has been compliant with keppra. Pt says that he thinks he may have taken his evening keppra but he is not sure. Pt was post ictal with EMS but is not A&Ox4/

## 2020-12-13 ENCOUNTER — Other Ambulatory Visit: Payer: Self-pay

## 2020-12-13 MED ORDER — DOCUSATE SODIUM 50 MG/5ML PO LIQD
ORAL | 0 refills | Status: DC
  Filled 2020-12-13: qty 100, fill #0

## 2020-12-13 NOTE — ED Provider Notes (Signed)
I assumed care of this patient.  Please see previous provider note for further details of Hx, PE.  Briefly patient is a 45 y.o. male who presented after a breakthrough seizure, pending imaging to rule out trauma from fall during seizure.  Imaging w/o acute injuries.  The patient appears reasonably screened and/or stabilized for discharge and I doubt any other medical condition or other [facility] requiring further screening, evaluation, or treatment in the ED at this time prior to discharge. Safe for discharge with strict return precautions.  Disposition: Discharge  Condition: Good  I have discussed the results, Dx and Tx plan with the patient/family who expressed understanding and agree(s) with the plan. Discharge instructions discussed at length. The patient/family was given strict return precautions who verbalized understanding of the instructions. No further questions at time of discharge.    ED Discharge Orders     None        Follow Up: Primary care provider  Call  to schedule an appointment for close follow up         Brian Ward, Brian Sessions, MD 12/13/20 [phone]

## 2021-02-18 ENCOUNTER — Other Ambulatory Visit: Payer: Self-pay

## 2021-02-18 ENCOUNTER — Encounter (HOSPITAL_BASED_OUTPATIENT_CLINIC_OR_DEPARTMENT_OTHER): Payer: Self-pay | Admitting: Emergency Medicine

## 2021-02-18 ENCOUNTER — Emergency Department (HOSPITAL_BASED_OUTPATIENT_CLINIC_OR_DEPARTMENT_OTHER)
Admission: EM | Admit: 2021-02-18 | Discharge: 2021-02-18 | Disposition: A | Discharge status: Home / Self Care | Payer: Commercial Managed Care - HMO | Attending: Emergency Medicine | Admitting: Emergency Medicine

## 2021-02-18 DIAGNOSIS — Z760 Encounter for issue of repeat prescription: Secondary | ICD-10-CM | POA: Diagnosis present

## 2021-02-18 MED ORDER — OXCARBAZEPINE 600 MG PO TABS
ORAL_TABLET | ORAL | 11 refills | Status: DC
  Filled 2021-02-18: qty 120, 30d supply, fill #0
  Filled 2021-05-06: qty 120, 30d supply, fill #1
  Filled 2021-07-19: qty 120, 30d supply, fill #2

## 2021-02-18 MED ORDER — DIVALPROEX SODIUM 500 MG PO DR TAB
500.0000 mg | DELAYED_RELEASE_TABLET | Freq: Two times a day (BID) | ORAL | 0 refills | Status: DC
  Filled 2021-02-18: qty 60, 30d supply, fill #0

## 2021-02-18 NOTE — Discharge Instructions (Addendum)
Continue taking the Depakote twice daily as prescribed.  Continue taking the Trileptal as prescribed.  Follow-up with neurology next month, return to the ED if you have any breakthrough seizures.

## 2021-02-18 NOTE — ED Triage Notes (Signed)
Pt states he doesn't want to be seen but he is here with another pt and she is making him get seen. Hz of seizures and not on medications. States he has not had a seizure in about 3 months. Intermittently he will get weak on left side but none at this time.

## 2021-02-18 NOTE — ED Provider Notes (Signed)
[facility] EMERGENCY DEPT Provider Note   CSN: [id number] Arrival date & time: 02/18/21  1639     History  Chief Complaint  Patient presents with   Seizures    Brian Ward is a 46 y.o. male.  HPI  Patient with history of epileptic seizures on Trileptal and Depakote presents due to medication refill request.  Patient is followed by neurology, has an appointment next month.  States he has been taking his Depakote twice daily, he has been doing so for the last month.  He has not had a seizure in 3 months, last was when he was seen at [facility] in November.  Today he is asymptomatic, states he needs medicine refill.  He takes Depakote twice daily and takes Trileptal twice daily as well.  Home Medications Prior to Admission medications   Medication Sig Start Date End Date Taking? Authorizing Provider  divalproex (DEPAKOTE) 500 MG DR tablet Take 1 tablet (500 mg total) by mouth 2 (two) times daily. 02/18/21 03/20/21  Sherrill Raring, PA-C  docusate (COLACE) 50 MG/5ML liquid Apply to each ear and let sit for 5 min each, then rinse 12/13/20   Cardama, Grayce Sessions, MD  oxcarbazepine (TRILEPTAL) 600 MG tablet TAKE 2 TABLETS (1,200 MG TOTAL) BY MOUTH 2 (TWO) TIMES DAILY. 02/18/21 02/18/22  Sherrill Raring, PA-C      Allergies    Patient has no known allergies.    Review of Systems   Review of Systems  Constitutional:  Negative for fever.       Med refill requested  Neurological:  Negative for seizures.   Physical Exam Updated Vital Signs BP (!) 149/88 (BP Location: Right Arm)    Pulse 72    Temp 98 F (36.7 C) (Oral)    Resp 18    SpO2 97%  Physical Exam Vitals and nursing note reviewed. Exam conducted with a chaperone present.  Constitutional:      Appearance: Normal appearance.  HENT:     Head: Normocephalic and atraumatic.  Eyes:     General: No scleral icterus.       Right eye: No discharge.        Left eye: No discharge.     Extraocular Movements: Extraocular  movements intact.     Pupils: Pupils are equal, round, and reactive to light.  Cardiovascular:     Rate and Rhythm: Normal rate and regular rhythm.     Pulses: Normal pulses.     Heart sounds: Normal heart sounds. No murmur heard.   No friction rub. No gallop.  Pulmonary:     Effort: Pulmonary effort is normal. No respiratory distress.     Breath sounds: Normal breath sounds.  Abdominal:     General: Abdomen is flat. Bowel sounds are normal. There is no distension.     Palpations: Abdomen is soft.     Tenderness: There is no abdominal tenderness.  Skin:    General: Skin is warm and dry.     Coloration: Skin is not jaundiced.  Neurological:     Mental Status: He is alert. Mental status is at baseline.     Coordination: Coordination normal.    ED Results / Procedures / Treatments   Labs (all labs ordered are listed, but only abnormal results are displayed) Labs Reviewed - No data to display  EKG None  Radiology No results found.  Procedures Procedures    Medications Ordered in ED Medications - No data to display  ED Course/ Medical  Decision Making/ A&P                           Medical Decision Making  Is a 47 year old male presenting due to medication refill request.  I reviewed his medical records including previous visits for seizures and his medication list.  His physical exam is unremarkable, he has not had any recent seizure-like activity.  He does have follow-up with neurology scheduled for next month, is currently on Depakote and Trileptal.  Patient has run out of his medication and is requesting refill, denies any recent seizures.  He is not postictal on exam, no gross focal deficits.  We will refill medicine and have him follow-up with neurology as scheduled.  Return precautions discussed and agreed on, do not feel he needs additional laboratory work-up or imaging at this time.  He was discharged in stable condition.        Final Clinical Impression(s) /  ED Diagnoses Final diagnoses:  Medication refill    Rx / DC Orders ED Discharge Orders          Ordered    oxcarbazepine (TRILEPTAL) 600 MG tablet        02/18/21 2029    divalproex (DEPAKOTE) 500 MG DR tablet  2 times daily        02/18/21 2029              Sherrill Raring, Hershal Coria 02/18/21 2204    Luna Fuse, MD 02/20/21 2218

## 2021-02-21 ENCOUNTER — Other Ambulatory Visit: Payer: Self-pay

## 2021-02-25 ENCOUNTER — Other Ambulatory Visit: Payer: Self-pay

## 2021-04-25 ENCOUNTER — Emergency Department (HOSPITAL_COMMUNITY)
Admission: EM | Admit: 2021-04-25 | Discharge: 2021-04-25 | Disposition: A | Discharge status: Home / Self Care | Payer: Commercial Managed Care - HMO | Attending: Emergency Medicine | Admitting: Emergency Medicine

## 2021-04-25 ENCOUNTER — Emergency Department (HOSPITAL_COMMUNITY): Payer: Commercial Managed Care - HMO

## 2021-04-25 ENCOUNTER — Encounter (HOSPITAL_COMMUNITY): Payer: Self-pay

## 2021-04-25 DIAGNOSIS — R569 Unspecified convulsions: Secondary | ICD-10-CM | POA: Diagnosis present

## 2021-04-25 DIAGNOSIS — R519 Headache, unspecified: Secondary | ICD-10-CM | POA: Insufficient documentation

## 2021-04-25 LAB — CBC
HCT: 41.4 % (ref 39.0–52.0)
Hemoglobin: 13.1 g/dL (ref 13.0–17.0)
MCH: 30.4 pg (ref 26.0–34.0)
MCHC: 31.6 g/dL (ref 30.0–36.0)
MCV: 96.1 fL (ref 80.0–100.0)
Platelets: 241 10*3/uL (ref 150–400)
RBC: 4.31 MIL/uL (ref 4.22–5.81)
RDW: 14.6 % (ref 11.5–15.5)
WBC: 4.6 10*3/uL (ref 4.0–10.5)
nRBC: 0 % (ref 0.0–0.2)

## 2021-04-25 LAB — BASIC METABOLIC PANEL
Anion gap: 14 (ref 5–15)
BUN: 9 mg/dL (ref 6–20)
CO2: 18 mmol/L — ABNORMAL LOW (ref 22–32)
Calcium: 9.3 mg/dL (ref 8.9–10.3)
Chloride: 109 mmol/L (ref 98–111)
Creatinine, Ser: 1.07 mg/dL (ref 0.61–1.24)
GFR, Estimated: >60 mL/min (ref >60)
Glucose, Bld: 81 mg/dL (ref 70–99)
Potassium: 4 mmol/L (ref 3.5–5.1)
Sodium: 141 mmol/L (ref 135–145)

## 2021-04-25 LAB — ETHANOL: Alcohol, Ethyl (B): <10 mg/dL (ref <10)

## 2021-04-25 MED ORDER — ZONISAMIDE 100 MG PO CAPS
600.0000 mg | ORAL_CAPSULE | Freq: Once | ORAL | Status: DC
  Filled 2021-04-25: qty 6

## 2021-04-25 NOTE — ED Provider Notes (Signed)
?[facility] ?Provider Note ? ? ?CSN: [id number] ?Arrival date & time: 04/25/21  1142 ? ?  ? ?History ? ?Chief Complaint  ?Patient presents with  ? Seizures  ? ? ?Brian Ward is a 46 y.o. male with a past medical history of seizures presenting to the ED with a chief complaint of breakthrough seizure.  States that he was at work when he apparently experienced a seizure.  He does not remember the event but does remember waking up in the ambulance and asking them "I had a seizure, right?"  States that he did not have a seizure for several months up until today.  He is compliant with his Keppra but admits that he ran out of his Depakote a few days ago and "I do not really even take it like that because I feel like it is too much for me but I was fine up until today."  He does have a "slight headache" but denies any neck pain, vision changes, numbness in arms or legs, tongue trauma, incontinence, chest pain.  States that he is back to his baseline now and would like to go home. ? ? ?Seizures ? ?  ? ?Home Medications ?Prior to Admission medications   ?Medication Sig Start Date End Date Taking? Authorizing Provider  ?divalproex (DEPAKOTE) 500 MG DR tablet Take 1 tablet (500 mg total) by mouth 2 (two) times daily. ?Patient taking differently: Take 500 mg by mouth daily. 02/18/21 06/11/21 Yes Sherrill Raring, PA-C  ?oxcarbazepine (TRILEPTAL) 600 MG tablet TAKE 2 TABLETS (1,200 MG TOTAL) BY MOUTH 2 (TWO) TIMES DAILY. ?Patient taking differently: Take 600 mg by mouth 2 (two) times daily. 02/18/21 02/18/22 Yes Sherrill Raring, PA-C  ?   ? ?Allergies    ?Pork-derived products   ? ?Review of Systems   ?Review of Systems  ?Constitutional:  Negative for appetite change, chills and fever.  ?HENT:  Negative for ear pain, rhinorrhea, sneezing and sore throat.   ?Eyes:  Negative for photophobia and visual disturbance.  ?Respiratory:  Negative for cough, chest tightness, shortness of breath and wheezing.    ?Cardiovascular:  Negative for chest pain and palpitations.  ?Gastrointestinal:  Negative for abdominal pain, blood in stool, constipation, diarrhea, nausea and vomiting.  ?Genitourinary:  Negative for dysuria, hematuria and urgency.  ?Musculoskeletal:  Negative for myalgias.  ?Skin:  Negative for rash.  ?Neurological:  Positive for seizures and headaches. Negative for dizziness, weakness and light-headedness.  ? ?Physical Exam ?Updated Vital Signs ?BP 113/77   Pulse (!) 58   Temp 98.6 ?F (37 ?C) (Oral)   Resp 14   Ht 5' 8.5" (1.74 m)   Wt 87 kg   SpO2 98%   BMI 28.74 kg/m?  ?Physical Exam ?Vitals and nursing note reviewed.  ?Constitutional:   ?   General: He is not in acute distress. ?   Appearance: He is well-developed.  ?HENT:  ?   Head: Normocephalic and atraumatic.  ?   Nose: Nose normal.  ?Eyes:  ?   General: No scleral icterus.    ?   Left eye: No discharge.  ?   Conjunctiva/sclera: Conjunctivae normal.  ?Cardiovascular:  ?   Rate and Rhythm: Normal rate and regular rhythm.  ?   Heart sounds: Normal heart sounds. No murmur heard. ?  No friction rub. No gallop.  ?Pulmonary:  ?   Effort: Pulmonary effort is normal. No respiratory distress.  ?   Breath sounds: Normal breath sounds.  ?Abdominal:  ?  General: Bowel sounds are normal. There is no distension.  ?   Palpations: Abdomen is soft.  ?   Tenderness: There is no abdominal tenderness. There is no guarding.  ?Musculoskeletal:     ?   General: Normal range of motion.  ?   Cervical back: Normal range of motion and neck supple.  ?Skin: ?   General: Skin is warm and dry.  ?   Findings: No rash.  ?Neurological:  ?   General: No focal deficit present.  ?   Mental Status: He is alert and oriented to person, place, and time.  ?   Cranial Nerves: No cranial nerve deficit.  ?   Sensory: No sensory deficit.  ?   Motor: No abnormal muscle tone.  ?   Coordination: Coordination normal.  ?   Comments: Pupils reactive. No facial asymmetry noted. Cranial nerves  appear grossly intact. Sensation intact to light touch on face, BUE and BLE. Strength 5/5 in BUE and BLE.   ? ? ?ED Results / Procedures / Treatments   ?Labs ?(all labs ordered are listed, but only abnormal results are displayed) ?Labs Reviewed  ?BASIC METABOLIC PANEL - Abnormal; Notable for the following components:  ?    Result Value  ? CO2 18 (*)   ? All other components within normal limits  ?CBC  ?ETHANOL  ?LEVETIRACETAM LEVEL  ? ? ?EKG ?None ? ?Radiology ?CT HEAD WO CONTRAST (5MM) ? ?Result Date: 04/25/2021 ?CLINICAL DATA:  Witnessed seizures.  History of seizures. EXAM: CT HEAD WITHOUT CONTRAST TECHNIQUE: Contiguous axial images were obtained from the base of the skull through the vertex without intravenous contrast. RADIATION DOSE REDUCTION: This exam was performed according to the departmental dose-optimization program which includes automated exposure control, adjustment of the mA and/or kV according to patient size and/or use of iterative reconstruction technique. COMPARISON:  12/13/2020 FINDINGS: Brain: Empty sella again identified. Advanced cerebral atrophy for age. No mass lesion, hemorrhage, hydrocephalus, acute infarct, intra-axial, or extra-axial fluid collection. Vascular: No hyperdense vessel or unexpected calcification. Skull: Normal Sinuses/Orbits: Normal imaged portions of the orbits and globes. Hypoplastic frontal sinuses. Chronic left mastoid effusion. Cerumen in the external ear canals bilaterally. Left-sided middle ear fluid. Other: None. IMPRESSION: 1. No acute intracranial abnormality. Age advanced cerebral atrophy. 2. Chronic left-sided mastoid effusion and middle air-fluid. Electronically Signed   By: Abigail Miyamoto M.D.   On: 04/25/2021 12:51   ? ?Procedures ?Procedures  ? ? ?Medications Ordered in ED ?Medications - No data to display ? ?ED Course/ Medical Decision Making/ A&P ?Clinical Course as of 04/25/21 1455  ?Mon Apr 25, 2021  ?1417 Glucose: 81 [HK]  ?1436 Alcohol, Ethyl (B): <10  [HK]  ?1446 500 nightly zonisamide. ?'600mg'$  here oral then 4 hours later another '600mg'$ , stay hydrated, can cause drowsiness. Per neurology [HK]  ?1453 Patient now tells me that he is actually taking his Depakote once a day instead of twice a day and would not like to be placed on any new antiepileptic medications. [HK]  ?  ?Clinical Course User Index ?[HK] Shelly Coss, Zyonna Vardaman, PA-C  ? ?                        ?Medical Decision Making ?Amount and/or Complexity of Data Reviewed ?Labs: ordered. Decision-making details documented in ED Course. ?Radiology: ordered. ? ? ?46 year old male presenting to the ED for breakthrough seizure.  He is compliant with his Keppra and did take it this morning but states  that he has not been compliant with his Depakote.  He apparently experienced a seizure at work this morning on his lunch break.  He does not remember what exactly happened in the seizure but he remembers waking up in the ambulance.  Reports a slight headache at this time but states that he otherwise feels back to his baseline.  On exam he has no neurological deficits.  Has no facial asymmetry.  No numbness or weakness.  Alert and oriented x3 to his baseline.  He is hemodynamically stable.  Will check labs, CT of his head and reassess. ? ?Labs ordered and interpreted, unremarkable BMP, CBC and ethanol level is 0.  Keppra level is pending.  CT of the head shows no acute findings.  Pharmacy tech completed medication reconciliation.  Patient is actually not on Keppra so I anticipate that his level will be low.  He is actually on Trileptal and Depakote.  He reports compliance with the Trileptal but again explains that he is not taking the Depakote and reported to pharmacy tech that he only takes it once a day.  I consulted neurology who recommends that if patient has not been compliant with the Depakote we can switch him to zonisamide by giving loading dose here and daily dose of 500 mg and allow him to follow-up with his outpatient  neurologist.  I had discussed this with the patient and he now tells me that he is actually taking the Depakote once a day and the Trileptal twice a day as he is supposed to.  Informed him of neurology recommendations and

## 2021-04-25 NOTE — ED Triage Notes (Addendum)
Pt BIB [facility] for eval of witnessed seizures w/ hx of same. Reports takes keppra, compliant w/ same. Pt arrives [facility] 15. [facility] 264 for EMS. ?

## 2021-04-25 NOTE — Discharge Instructions (Addendum)
Please follow up with Dr. Amparo [place] office regarding today's visit. ?It is important for you to take both of your seizure medications in order to prevent any breakthrough seizures. ?Return to the ER if you start to experience additional symptoms, severe headache, blurry vision, numbness in arms or legs. ? ?

## 2021-04-26 LAB — LEVETIRACETAM LEVEL: Levetiracetam Lvl: <2 ug/mL — ABNORMAL LOW (ref 10.0–40.0)

## 2021-05-06 ENCOUNTER — Other Ambulatory Visit: Payer: Self-pay

## 2021-06-02 ENCOUNTER — Other Ambulatory Visit: Payer: Self-pay

## 2021-06-02 ENCOUNTER — Ambulatory Visit (HOSPITAL_COMMUNITY)
Admission: EM | Admit: 2021-06-02 | Discharge: 2021-06-02 | Disposition: A | Discharge status: Home / Self Care | Payer: Managed Care, Other (non HMO) | Attending: Emergency Medicine | Admitting: Emergency Medicine

## 2021-06-02 ENCOUNTER — Encounter (HOSPITAL_COMMUNITY): Payer: Self-pay | Admitting: Emergency Medicine

## 2021-06-02 DIAGNOSIS — H109 Unspecified conjunctivitis: Secondary | ICD-10-CM | POA: Diagnosis not present

## 2021-06-02 DIAGNOSIS — H6123 Impacted cerumen, bilateral: Secondary | ICD-10-CM

## 2021-06-02 MED ORDER — TETRACAINE HCL 0.5 % OP SOLN
1.0000 [drp] | Freq: Once | OPHTHALMIC | Status: AC
  Administered 2021-06-02: 2 [drp] via OPHTHALMIC

## 2021-06-02 MED ORDER — POLYMYXIN B-TRIMETHOPRIM 10000-0.1 UNIT/ML-% OP SOLN
1.0000 [drp] | OPHTHALMIC | 0 refills | Status: DC
  Filled 2021-06-02: qty 10, 30d supply, fill #0

## 2021-06-02 MED ORDER — TETRACAINE HCL 0.5 % OP SOLN
OPHTHALMIC | Status: AC
Start: 2021-06-02 — End: ?
  Filled 2021-06-02: qty 4

## 2021-06-02 MED ORDER — FLUORESCEIN SODIUM 1 MG OP STRP
ORAL_STRIP | OPHTHALMIC | Status: AC
  Filled 2021-06-02: qty 1
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
## 2021-06-02 NOTE — Discharge Instructions (Addendum)
Mix 1/2 warm water with 1/2 hydrogen peroxide. Put some of the mixture into an ear and lay down so it stays in your ear for 10-15 minutes. Then do the other side. You can do this daily until your ear wax is clear.  ? ?Use warm compresses at least twice a day your right eye with a clean washcloth each time. If your eye redness and feeling like something is in there doesn't heal with the eye drops, please go see Dr. Hollice Espy the eye doctor.  ? ? ?
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
## 2021-06-08 ENCOUNTER — Emergency Department (HOSPITAL_COMMUNITY): Payer: Commercial Managed Care - HMO

## 2021-06-08 ENCOUNTER — Encounter (HOSPITAL_COMMUNITY): Payer: Self-pay

## 2021-06-08 ENCOUNTER — Emergency Department (HOSPITAL_COMMUNITY)
Admission: EM | Admit: 2021-06-08 | Discharge: 2021-06-08 | Disposition: A | Discharge status: Home / Self Care | Payer: Commercial Managed Care - HMO | Attending: Emergency Medicine | Admitting: Emergency Medicine

## 2021-06-08 ENCOUNTER — Other Ambulatory Visit: Payer: Self-pay

## 2021-06-08 DIAGNOSIS — R569 Unspecified convulsions: Secondary | ICD-10-CM | POA: Insufficient documentation

## 2021-06-08 DIAGNOSIS — H1131 Conjunctival hemorrhage, right eye: Secondary | ICD-10-CM | POA: Insufficient documentation

## 2021-06-08 LAB — CBC WITH DIFFERENTIAL/PLATELET
Abs Immature Granulocytes: 0.03 10*3/uL (ref 0.00–0.07)
Basophils Absolute: 0 10*3/uL (ref 0.0–0.1)
Basophils Relative: 0 %
Eosinophils Absolute: 0 10*3/uL (ref 0.0–0.5)
Eosinophils Relative: 0 %
HCT: 38.7 % — ABNORMAL LOW (ref 39.0–52.0)
Hemoglobin: 12.4 g/dL — ABNORMAL LOW (ref 13.0–17.0)
Immature Granulocytes: 0 %
Lymphocytes Relative: 12 %
Lymphs Abs: 1.1 10*3/uL (ref 0.7–4.0)
MCH: 30.2 pg (ref 26.0–34.0)
MCHC: 32 g/dL (ref 30.0–36.0)
MCV: 94.2 fL (ref 80.0–100.0)
Monocytes Absolute: 0.7 10*3/uL (ref 0.1–1.0)
Monocytes Relative: 7 %
Neutro Abs: 7.1 10*3/uL (ref 1.7–7.7)
Neutrophils Relative %: 81 %
Platelets: 224 10*3/uL (ref 150–400)
RBC: 4.11 MIL/uL — ABNORMAL LOW (ref 4.22–5.81)
RDW: 14 % (ref 11.5–15.5)
WBC: 9 10*3/uL (ref 4.0–10.5)
nRBC: 0 % (ref 0.0–0.2)

## 2021-06-08 LAB — BASIC METABOLIC PANEL
Anion gap: 7 (ref 5–15)
BUN: 8 mg/dL (ref 6–20)
CO2: 23 mmol/L (ref 22–32)
Calcium: 9.1 mg/dL (ref 8.9–10.3)
Chloride: 108 mmol/L (ref 98–111)
Creatinine, Ser: 0.97 mg/dL (ref 0.61–1.24)
GFR, Estimated: >60 mL/min (ref >60)
Glucose, Bld: 83 mg/dL (ref 70–99)
Potassium: 4.2 mmol/L (ref 3.5–5.1)
Sodium: 138 mmol/L (ref 135–145)

## 2021-06-08 LAB — ETHANOL: Alcohol, Ethyl (B): <10 mg/dL (ref <10)

## 2021-06-08 LAB — VALPROIC ACID LEVEL: Valproic Acid Lvl: 12 ug/mL — ABNORMAL LOW (ref 50.0–100.0)

## 2021-06-08 MED ORDER — LEVETIRACETAM 500 MG PO TABS
500.0000 mg | ORAL_TABLET | Freq: Two times a day (BID) | ORAL | 0 refills | Status: DC
  Filled 2021-06-08: qty 60, 30d supply, fill #0

## 2021-06-08 MED ORDER — LEVETIRACETAM IN NACL 1000 MG/100ML IV SOLN
1000.0000 mg | Freq: Once | INTRAVENOUS | Status: AC
Start: 2021-06-08 — End: 2021-06-08
  Administered 2021-06-08: 1000 mg via INTRAVENOUS
  Filled 2021-06-08: qty 100

## 2021-06-08 MED ORDER — DIVALPROEX SODIUM 500 MG PO DR TAB
500.0000 mg | DELAYED_RELEASE_TABLET | Freq: Two times a day (BID) | ORAL | 0 refills | Status: DC
Start: 2021-06-08 — End: 2021-08-03
  Filled 2021-06-08: qty 60, 30d supply, fill #0

## 2021-06-08 NOTE — ED Provider Notes (Signed)
?[facility] ?Provider Note ? ? ?CSN: [id number] ?Arrival date & time: 06/08/21  1607 ? ?  ? ?History ? ?Chief Complaint  ?Patient presents with  ? Seizures  ? ? ?Brian Ward is a 46 y.o. male. ? ?47 yo M with a significant past medical history of seizures comes in with a chief complaint of having a seizure.  Mom thinks he had 2 told me that when she got home from work found him on the ground and he was easily arousable and then followed her into the car she took him to the bank.  She left him in the car and when she got back realized that he was having another seizure.  The seizure resolved but she noticed that he had some red-tinged to the outside of his eye which concerned her and so she brought him to the ED for evaluation.  She knows that he is on 2 different medications for seizures but she is not sure exactly what they are.  She thinks 1 is Keppra and the other one she thinks are is with an M ? ? ?Seizures ? ?  ? ?Home Medications ?Prior to Admission medications   ?Medication Sig Start Date End Date Taking? Authorizing Provider  ?levETIRAcetam (KEPPRA) 500 MG tablet Take 1 tablet (500 mg total) by mouth 2 (two) times daily. 06/08/21  Yes Deno Etienne, DO  ?divalproex (DEPAKOTE) 500 MG DR tablet Take 1 tablet (500 mg total) by mouth 2 (two) times daily. 06/08/21 07/08/21  Deno Etienne, DO  ?oxcarbazepine (TRILEPTAL) 600 MG tablet TAKE 2 TABLETS (1,200 MG TOTAL) BY MOUTH 2 (TWO) TIMES DAILY. ?Patient taking differently: Take 600 mg by mouth 2 (two) times daily. 02/18/21 02/18/22  Sherrill Raring, PA-C  ?trimethoprim-polymyxin b (POLYTRIM) ophthalmic solution Place 1 drop into the right eye every 4 (four) hours. 06/02/21   Carvel Getting, NP  ?   ? ?Allergies    ?Pork-derived products   ? ?Review of Systems   ?Review of Systems  ?Neurological:  Positive for seizures.  ? ?Physical Exam ?Updated Vital Signs ?BP 107/86   Pulse 64   Temp 97.7 ?F (36.5 ?C) (Oral)   Resp 14   Ht 5' 8.5" (1.74  m)   Wt 87 kg   SpO2 100%   BMI 28.74 kg/m?  ?Physical Exam ?Vitals and nursing note reviewed.  ?Constitutional:   ?   Appearance: He is well-developed.  ?HENT:  ?   Head: Normocephalic.  ?   Comments: Superficial scratch to the forehead. ?Eyes:  ?   Pupils: Pupils are equal, round, and reactive to light.  ?   Comments: Right subconjunctival hematoma pupils 3 mm and reactive.  ?Neck:  ?   Vascular: No JVD.  ?Cardiovascular:  ?   Rate and Rhythm: Normal rate and regular rhythm.  ?   Heart sounds: No murmur heard. ?  No friction rub. No gallop.  ?Pulmonary:  ?   Effort: No respiratory distress.  ?   Breath sounds: No wheezing.  ?Abdominal:  ?   General: There is no distension.  ?   Tenderness: There is no abdominal tenderness. There is no guarding or rebound.  ?Musculoskeletal:     ?   General: Normal range of motion.  ?   Cervical back: Normal range of motion and neck supple.  ?Skin: ?   Coloration: Skin is not pale.  ?   Findings: No rash.  ?Neurological:  ?   Mental Status:  He is alert and oriented to person, place, and time.  ?   Comments: Patient is a sleepy will answer some direct questions.  ?Psychiatric:     ?   Behavior: Behavior normal.  ? ? ?ED Results / Procedures / Treatments   ?Labs ?(all labs ordered are listed, but only abnormal results are displayed) ?Labs Reviewed  ?CBC WITH DIFFERENTIAL/PLATELET - Abnormal; Notable for the following components:  ?    Result Value  ? RBC 4.11 (*)   ? Hemoglobin 12.4 (*)   ? HCT 38.7 (*)   ? All other components within normal limits  ?VALPROIC ACID LEVEL - Abnormal; Notable for the following components:  ? Valproic Acid Lvl 12 (*)   ? All other components within normal limits  ?BASIC METABOLIC PANEL  ?ETHANOL  ? ? ?EKG ?EKG Interpretation ? ?Date/Time:  Wednesday Jun 08 2021 19:29:42 EDT ?Ventricular Rate:  62 ?PR Interval:  161 ?QRS Duration: 96 ?QT Interval:  415 ?QTC Calculation: 422 ?R Axis:   57 ?Text Interpretation: Sinus rhythm Probable left atrial  enlargement ST elev, probable normal early repol pattern Since last tracing rate slower Otherwise no significant change Confirmed by Deno Etienne [phone]) on 06/08/2021 7:32:01 PM ? ?Radiology ?CT Head Wo Contrast ? ?Result Date: 06/08/2021 ?CLINICAL DATA:  Trauma, seizures, altered mental status EXAM: CT HEAD WITHOUT CONTRAST TECHNIQUE: Contiguous axial images were obtained from the base of the skull through the vertex without intravenous contrast. RADIATION DOSE REDUCTION: This exam was performed according to the departmental dose-optimization program which includes automated exposure control, adjustment of the mA and/or kV according to patient size and/or use of iterative reconstruction technique. COMPARISON:  04/25/2021 FINDINGS: Brain: No acute intracranial findings are seen. Ventricles are not dilated. There is no shift of midline structures. Cortical sulci are prominent. There is no focal mass effect. Vascular: Unremarkable. Skull: Unremarkable. Sinuses/Orbits: There is left mastoid effusion. There is mild mucosal thickening in the ethmoid and maxillary sinuses. Other: There is increased amount of CSF insula suggesting partial empty sella. IMPRESSION: No acute intracranial findings are seen.  Atrophy. Chronic sinusitis.  Chronic left mastoid effusion. Electronically Signed   By: Elmer Picker M.D.   On: 06/08/2021 19:34   ? ?Procedures ?Procedures  ? ? ?Medications Ordered in ED ?Medications  ?levETIRAcetam (KEPPRA) IVPB 1000 mg/100 mL premix (0 mg Intravenous Stopped 06/08/21 1850)  ? ? ?ED Course/ Medical Decision Making/ A&P ?  ?                        ?Medical Decision Making ?Amount and/or Complexity of Data Reviewed ?Radiology: ordered. ? ?Risk ?Prescription drug management. ? ? ?46 yo M with a chief complaint of what sounds like multiple seizures today.  Mom does not think he has been taking his medications at night.  He has had multiple ED visits for similar.  Most recent visit when I reviewed the  records there was recommendation to change his antiepileptic therapy from Depakote to zonisamide, however the patient had declined this and elected to continue his therapy.  At that time he was on Keppra and Depakote.  This was about a month and a half ago. ? ?Patient has a right subconjunctival hematoma.  Scratch to the forehead.  Still not back to baseline we will obtain a CT of the head.  Give a bolus dose of Keppra. ? ?Patient is much closer to baseline on repeat assessment.  He is asking for something to  eat and drink.  Awaiting blood work CT of the head. ? ?No significant electrolyte abnormality.  No significant anemia.  CT of the head without intracranial pathology. ? ?Patient is back to baseline and would like to go home.  He is Depakote level was very low.  I feel it is unlikely that he is taking this medication regularly.  I discussed need for compliance.  We will have him follow-up with his neurologist. ? ?7:45 PM:  I have discussed the diagnosis/risks/treatment options with the patient and family.  Evaluation and diagnostic testing in the emergency department does not suggest an emergent condition requiring admission or immediate intervention beyond what has been performed at this time.  They will follow up with  neuro. We also discussed returning to the ED immediately if new or worsening sx occur. We discussed the sx which are most concerning (e.g., sudden worsening pain, fever, inability to tolerate by mouth) that necessitate immediate return. Medications administered to the patient during their visit and any new prescriptions provided to the patient are listed below. ? ?Medications given during this visit ?Medications  ?levETIRAcetam (KEPPRA) IVPB 1000 mg/100 mL premix (0 mg Intravenous Stopped 06/08/21 1850)  ? ? ? ?The patient appears reasonably screen and/or stabilized for discharge and I doubt any other medical condition or other [facility] requiring further screening, evaluation, or treatment in the ED at  this time prior to discharge.  ? ? ? ? ? ? ? ? ?Final Clinical Impression(s) / ED Diagnoses ?Final diagnoses:  ?Seizure ([place])  ? ? ?Rx / DC Orders ?ED Discharge Orders   ? ?      Ordered  ?  divalproex (DEPAKOTE) 500 MG DR

## 2021-06-08 NOTE — ED Notes (Signed)
Pt verbalized understanding of d/c instructions, meds, and followup care. Denies questions. VSS, no distress noted. Steady gait to exit with all belongings.  ?

## 2021-06-08 NOTE — ED Notes (Signed)
Unable to get labs at this time. Phlebotomy made aware. ?
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
## 2021-06-08 NOTE — ED Provider Triage Note (Signed)
Emergency Medicine Provider Triage Evaluation Note ? ?Brian Ward , a 47 y.o. male  was evaluated in triage.  Pt complains of possible seizure, postictal state.  Patient's wife found patient on the floor.  Reportedly had a seizure.  Patient was incontinent.  Patient currently seems to be in a postictal state, sluggish, not following commands at this time.  Patient does have history of seizures.  Patient's wife currently parking car and not in room to provide further history at this time ? ?Review of Systems  ?Positive: Urinary incontinence, confusion, possible seizure ?Negative: Unable to perform ROS due to patient's mental status ? ?Physical Exam  ?There were no vitals taken for this visit. ?Gen:   Awake, no distress   ?Resp:  Normal effort  ?MSK:   Moves extremities without difficulty  ?Other:  Unable to answer questions or follow commands ? ?Medical Decision Making  ?Medically screening exam initiated at 4:13 PM.  Appropriate orders placed.  Brian Ward was informed that the remainder of the evaluation will be completed by another provider, this initial triage assessment does not replace that evaluation, and the importance of remaining in the ED until their evaluation is complete. ? ? ?  ?Dorothyann Peng, PA-C ?06/08/21 1614 ? ?

## 2021-06-08 NOTE — Discharge Instructions (Signed)
If you do not take your medication that is prescribed to you for seizures he will have seizures.  You need to take your medication as prescribed.  Do not drive a car or climb to tall heights or operate heavy machinery for at least 6 months.  Follow-up with your neurologist in the office. ?

## 2021-06-09 ENCOUNTER — Other Ambulatory Visit: Payer: Self-pay

## 2021-06-16 ENCOUNTER — Other Ambulatory Visit: Payer: Self-pay

## 2021-07-13 ENCOUNTER — Encounter (HOSPITAL_COMMUNITY): Payer: Self-pay | Admitting: Emergency Medicine

## 2021-07-13 ENCOUNTER — Other Ambulatory Visit: Payer: Self-pay

## 2021-07-13 ENCOUNTER — Emergency Department (HOSPITAL_COMMUNITY)
Admission: EM | Admit: 2021-07-13 | Discharge: 2021-07-13 | Disposition: A | Discharge status: Home / Self Care | Payer: Managed Care, Other (non HMO) | Attending: Emergency Medicine | Admitting: Emergency Medicine

## 2021-07-13 DIAGNOSIS — R569 Unspecified convulsions: Secondary | ICD-10-CM | POA: Insufficient documentation

## 2021-07-13 LAB — VALPROIC ACID LEVEL: Valproic Acid Lvl: <10 ug/mL — ABNORMAL LOW (ref 50.0–100.0)

## 2021-07-13 LAB — BASIC METABOLIC PANEL
Anion gap: 20 — ABNORMAL HIGH (ref 5–15)
BUN: 9 mg/dL (ref 6–20)
CO2: 16 mmol/L — ABNORMAL LOW (ref 22–32)
Calcium: 9.7 mg/dL (ref 8.9–10.3)
Chloride: 101 mmol/L (ref 98–111)
Creatinine, Ser: 1.23 mg/dL (ref 0.61–1.24)
GFR, Estimated: >60 mL/min (ref >60)
Glucose, Bld: 160 mg/dL — ABNORMAL HIGH (ref 70–99)
Potassium: 3.6 mmol/L (ref 3.5–5.1)
Sodium: 137 mmol/L (ref 135–145)

## 2021-07-13 LAB — CBC WITH DIFFERENTIAL/PLATELET
Abs Immature Granulocytes: 0.02 10*3/uL (ref 0.00–0.07)
Basophils Absolute: 0 10*3/uL (ref 0.0–0.1)
Basophils Relative: 0 %
Eosinophils Absolute: 0.1 10*3/uL (ref 0.0–0.5)
Eosinophils Relative: 2 %
HCT: 42.2 % (ref 39.0–52.0)
Hemoglobin: 13.2 g/dL (ref 13.0–17.0)
Immature Granulocytes: 0 %
Lymphocytes Relative: 31 %
Lymphs Abs: 1.9 10*3/uL (ref 0.7–4.0)
MCH: 29.7 pg (ref 26.0–34.0)
MCHC: 31.3 g/dL (ref 30.0–36.0)
MCV: 95 fL (ref 80.0–100.0)
Monocytes Absolute: 0.5 10*3/uL (ref 0.1–1.0)
Monocytes Relative: 9 %
Neutro Abs: 3.4 10*3/uL (ref 1.7–7.7)
Neutrophils Relative %: 58 %
Platelets: 268 10*3/uL (ref 150–400)
RBC: 4.44 MIL/uL (ref 4.22–5.81)
RDW: 13.4 % (ref 11.5–15.5)
WBC: 6 10*3/uL (ref 4.0–10.5)
nRBC: 0 % (ref 0.0–0.2)

## 2021-07-13 MED ORDER — LEVETIRACETAM IN NACL 1000 MG/100ML IV SOLN
1000.0000 mg | Freq: Once | INTRAVENOUS | Status: DC
  Filled 2021-07-13: qty 100
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
## 2021-07-13 NOTE — ED Notes (Signed)
Pt states that his sister is here and he does not wish to stay, AMA form signed. PA Sofia made aware. Pt ambulatory to ED waiting room.
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
## 2021-07-13 NOTE — ED Triage Notes (Addendum)
Pt BIB EMS hx of epilepsy, non compliant with meds, girlfriend states pt had seizure lasting approx. 5 minutes. Postictal on EMS arrival. EMS reports that pt states that he normally has one seizure and is okay afterwards. Pt groggy upon arrival, able to ambulate to stretcher.

## 2021-07-13 NOTE — ED Notes (Signed)
Pt left AMA, states that he has his medicine at home and that his sister is here and he wishes to leave. Pt provided with paper shirt and socks, ambulatory to ED waiting room.

## 2021-07-13 NOTE — ED Notes (Signed)
Pt refused Keppra, states that he has not taken it in over a year. PA Sofia made aware.

## 2021-07-14 ENCOUNTER — Telehealth: Payer: Self-pay | Admitting: Neurology

## 2021-07-14 NOTE — Telephone Encounter (Signed)
I called the pt to see if he would like to continue following up in our office and make an appointment. I left him a message to give [place] a call back.

## 2021-07-19 ENCOUNTER — Other Ambulatory Visit: Payer: Self-pay

## 2021-07-28 ENCOUNTER — Other Ambulatory Visit: Payer: Self-pay

## 2021-07-28 ENCOUNTER — Encounter (HOSPITAL_COMMUNITY): Payer: Self-pay

## 2021-07-28 ENCOUNTER — Ambulatory Visit (HOSPITAL_COMMUNITY)
Admission: EM | Admit: 2021-07-28 | Discharge: 2021-07-28 | Disposition: A | Discharge status: Home / Self Care | Payer: Self-pay | Attending: Emergency Medicine | Admitting: Emergency Medicine

## 2021-07-28 DIAGNOSIS — H6122 Impacted cerumen, left ear: Secondary | ICD-10-CM

## 2021-07-28 DIAGNOSIS — H60501 Unspecified acute noninfective otitis externa, right ear: Secondary | ICD-10-CM

## 2021-07-28 MED ORDER — CIPROFLOXACIN-DEXAMETHASONE 0.3-0.1 % OT SUSP
4.0000 [drp] | Freq: Two times a day (BID) | OTIC | 0 refills | Status: DC
  Filled 2021-07-28: qty 2.8, 7d supply, fill #0

## 2021-07-28 MED ORDER — CIPROFLOXACIN-DEXAMETHASONE 0.3-0.1 % OT SUSP
4.0000 [drp] | Freq: Two times a day (BID) | OTIC | 0 refills | Status: AC
  Filled 2021-07-28: qty 7.5, 8d supply, fill #0
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
## 2021-07-28 NOTE — Discharge Instructions (Signed)
Use the drops twice daily in BOTH ears for at least 7 days.  Follow up with the ear, nose, and throat specialists (ENT) for the ringing in your ears.  Please go to the emergency department if symptoms worsen or do not improve.

## 2021-07-28 NOTE — ED Triage Notes (Signed)
Pt states bilateral ear fullness,pain  and trouble hearing states this has been going on for years but this episode has been for one month. Pt tried warm water an peroxide at home with no change.

## 2021-08-03 ENCOUNTER — Other Ambulatory Visit: Payer: Self-pay

## 2021-08-03 ENCOUNTER — Ambulatory Visit (INDEPENDENT_AMBULATORY_CARE_PROVIDER_SITE_OTHER): Payer: Commercial Managed Care - HMO | Admitting: Neurology

## 2021-08-03 ENCOUNTER — Encounter: Payer: Self-pay | Admitting: Neurology

## 2021-08-03 VITALS — BP 131/79 | HR 86 | Ht 68.5 in | Wt 180.6 lb

## 2021-08-03 DIAGNOSIS — G40009 Localization-related (focal) (partial) idiopathic epilepsy and epileptic syndromes with seizures of localized onset, not intractable, without status epilepticus: Secondary | ICD-10-CM

## 2021-08-03 DIAGNOSIS — E236 Other disorders of pituitary gland: Secondary | ICD-10-CM

## 2021-08-03 MED ORDER — LEVETIRACETAM 500 MG PO TABS
500.0000 mg | ORAL_TABLET | Freq: Two times a day (BID) | ORAL | 11 refills | Status: DC
  Filled 2021-08-03 – 2021-09-02 (×3): qty 60, 30d supply, fill #0
  Filled 2021-09-28: qty 60, 30d supply, fill #1

## 2021-08-03 MED ORDER — OXCARBAZEPINE 600 MG PO TABS
ORAL_TABLET | ORAL | 11 refills | Status: DC
  Filled 2021-08-03: qty 120, fill #0
  Filled 2021-09-02 – 2021-09-28 (×3): qty 120, 30d supply, fill #0
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
## 2021-08-03 NOTE — Addendum Note (Signed)
Addended by: Jake Seats on: 08/03/2021 01:10 PM   Modules accepted: Orders
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
## 2021-08-10 ENCOUNTER — Other Ambulatory Visit: Payer: Self-pay

## 2021-08-12 ENCOUNTER — Ambulatory Visit
Admission: RE | Admit: 2021-08-12 | Discharge: 2021-08-12 | Disposition: A | Discharge status: Home / Self Care | Payer: Commercial Managed Care - HMO | Source: Ambulatory Visit | Attending: Neurology | Admitting: Neurology

## 2021-08-12 ENCOUNTER — Other Ambulatory Visit: Payer: Self-pay

## 2021-08-12 ENCOUNTER — Telehealth: Payer: Self-pay | Admitting: Neurology

## 2021-08-12 VITALS — BP 135/82 | HR 68

## 2021-08-12 DIAGNOSIS — E236 Other disorders of pituitary gland: Secondary | ICD-10-CM

## 2021-08-12 DIAGNOSIS — G40009 Localization-related (focal) (partial) idiopathic epilepsy and epileptic syndromes with seizures of localized onset, not intractable, without status epilepticus: Secondary | ICD-10-CM

## 2021-08-12 DIAGNOSIS — R569 Unspecified convulsions: Secondary | ICD-10-CM

## 2021-08-12 MED ORDER — ACETAZOLAMIDE 250 MG PO TABS
ORAL_TABLET | ORAL | 6 refills | Status: DC
  Filled 2021-08-12 – 2021-09-28 (×4): qty 120, 30d supply, fill #0

## 2021-08-12 NOTE — Telephone Encounter (Signed)
Called patient regarding LP elevated opening pressure. Discussed starting acetazolamide '250mg'$  BID x 1 week, then increase to 2 tabs BID. Side effects discussed. Also recommended f/u with eye doctor, he will set up an appt this Monday. He knows to call for any changes. Tolerated LP well.

## 2021-08-12 NOTE — Discharge Instructions (Signed)

## 2021-08-15 ENCOUNTER — Other Ambulatory Visit: Payer: Self-pay

## 2021-08-16 LAB — CSF CELL COUNT WITH DIFFERENTIAL
RBC Count, CSF: 0 cells/uL
TOTAL NUCLEATED CELL: 1 cells/uL (ref 0–5)

## 2021-08-16 LAB — CSF CULTURE W GRAM STAIN
GRAM STAIN:: NONE SEEN
MICRO NUMBER:: 13617555
Result:: NO GROWTH
SPECIMEN QUALITY:: ADEQUATE

## 2021-08-16 LAB — PROTEIN, CSF: Total Protein, CSF: 27 mg/dL (ref 15–45)

## 2021-08-16 LAB — GLUCOSE, CSF: Glucose, CSF: 53 mg/dL (ref 40–80)

## 2021-08-19 ENCOUNTER — Other Ambulatory Visit: Payer: Self-pay

## 2021-09-02 ENCOUNTER — Other Ambulatory Visit: Payer: Self-pay

## 2021-09-28 ENCOUNTER — Other Ambulatory Visit: Payer: Self-pay

## 2021-10-24 ENCOUNTER — Telehealth: Payer: Self-pay | Admitting: Neurology

## 2021-10-24 NOTE — Telephone Encounter (Signed)
Pt called, he said his lawyer called him to see if aquino will write a letter stating why he isnt/cant work due to his seizures. He has court tomorrow and she needs it then. I let him know aquino was out on vacation and more than likely, we wont be able to, esp by tomorrow. He said he will let his lawyer know.

## 2021-11-22 ENCOUNTER — Encounter (HOSPITAL_COMMUNITY): Payer: Self-pay | Admitting: *Deleted

## 2021-11-22 ENCOUNTER — Ambulatory Visit (HOSPITAL_COMMUNITY)
Admission: EM | Admit: 2021-11-22 | Discharge: 2021-11-22 | Disposition: A | Discharge status: Home / Self Care | Payer: Commercial Managed Care - HMO | Attending: Physician Assistant | Admitting: Physician Assistant

## 2021-11-22 DIAGNOSIS — Z1152 Encounter for screening for COVID-19: Secondary | ICD-10-CM | POA: Insufficient documentation

## 2021-11-22 DIAGNOSIS — J069 Acute upper respiratory infection, unspecified: Secondary | ICD-10-CM | POA: Diagnosis not present

## 2021-11-22 DIAGNOSIS — K047 Periapical abscess without sinus: Secondary | ICD-10-CM | POA: Diagnosis not present

## 2021-11-22 MED ORDER — AMOXICILLIN 500 MG PO CAPS
500.0000 mg | ORAL_CAPSULE | Freq: Three times a day (TID) | ORAL | 0 refills | Status: DC
  Filled 2021-11-22: qty 21, 7d supply, fill #0
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
## 2021-11-22 NOTE — ED Triage Notes (Signed)
Pt states that he has been having some dental pain on the left lower side. He is having facial swelling and pain.    He has been sick coughing and congestion X 3 days he has taken a lot of OTC meds to avoid coming for the cough but he wonders if the OTC meds he was taking for the cough mad his face swell.

## 2021-11-23 ENCOUNTER — Other Ambulatory Visit: Payer: Self-pay

## 2021-11-23 LAB — SARS CORONAVIRUS 2 (TAT 6-24 HRS): SARS Coronavirus 2: NEGATIVE

## 2021-11-29 ENCOUNTER — Encounter: Payer: Self-pay | Admitting: Neurology

## 2021-11-29 ENCOUNTER — Other Ambulatory Visit: Payer: Self-pay

## 2021-11-29 ENCOUNTER — Ambulatory Visit (INDEPENDENT_AMBULATORY_CARE_PROVIDER_SITE_OTHER): Payer: Commercial Managed Care - HMO | Admitting: Neurology

## 2021-11-29 VITALS — BP 131/79 | HR 74 | Ht 68.0 in | Wt 172.0 lb

## 2021-11-29 DIAGNOSIS — H7492 Unspecified disorder of left middle ear and mastoid: Secondary | ICD-10-CM | POA: Diagnosis not present

## 2021-11-29 DIAGNOSIS — G932 Benign intracranial hypertension: Secondary | ICD-10-CM

## 2021-11-29 DIAGNOSIS — G40009 Localization-related (focal) (partial) idiopathic epilepsy and epileptic syndromes with seizures of localized onset, not intractable, without status epilepticus: Secondary | ICD-10-CM | POA: Diagnosis not present

## 2021-11-29 MED ORDER — TOPIRAMATE 25 MG PO TABS
25.0000 mg | ORAL_TABLET | Freq: Two times a day (BID) | ORAL | 11 refills | Status: DC
  Filled 2021-11-29: qty 60, 30d supply, fill #0
  Filled 2022-01-16: qty 60, 30d supply, fill #1
  Filled 2022-02-24: qty 60, 30d supply, fill #2

## 2021-11-29 MED ORDER — LEVETIRACETAM 1000 MG PO TABS
1000.0000 mg | ORAL_TABLET | Freq: Two times a day (BID) | ORAL | 11 refills | Status: DC
  Filled 2021-11-29: qty 60, 30d supply, fill #0

## 2021-11-29 MED ORDER — OXCARBAZEPINE 600 MG PO TABS
1200.0000 mg | ORAL_TABLET | Freq: Two times a day (BID) | ORAL | 11 refills | Status: DC
  Filled 2021-11-29: qty 120, 30d supply, fill #0
  Filled 2022-01-16: qty 120, 30d supply, fill #1
  Filled 2022-02-24: qty 120, 30d supply, fill #2
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
## 2021-12-02 ENCOUNTER — Ambulatory Visit: Payer: Commercial Managed Care - HMO | Admitting: Neurology

## 2022-01-10 ENCOUNTER — Emergency Department (HOSPITAL_COMMUNITY): Payer: Commercial Managed Care - HMO

## 2022-01-10 ENCOUNTER — Inpatient Hospital Stay (HOSPITAL_COMMUNITY): Payer: Commercial Managed Care - HMO

## 2022-01-10 ENCOUNTER — Inpatient Hospital Stay (HOSPITAL_COMMUNITY)
Admission: EM | Admit: 2022-01-10 | Discharge: 2022-01-12 | DRG: 101 | Disposition: A | Discharge status: Home / Self Care | Payer: Commercial Managed Care - HMO | Attending: Internal Medicine | Admitting: Internal Medicine

## 2022-01-10 DIAGNOSIS — H510 Palsy (spasm) of conjugate gaze: Secondary | ICD-10-CM | POA: Diagnosis present

## 2022-01-10 DIAGNOSIS — Z888 Allergy status to other drugs, medicaments and biological substances status: Secondary | ICD-10-CM

## 2022-01-10 DIAGNOSIS — Q018 Encephalocele of other sites: Secondary | ICD-10-CM

## 2022-01-10 DIAGNOSIS — R569 Unspecified convulsions: Secondary | ICD-10-CM | POA: Diagnosis not present

## 2022-01-10 DIAGNOSIS — Z91199 Patient's noncompliance with other medical treatment and regimen due to unspecified reason: Secondary | ICD-10-CM

## 2022-01-10 DIAGNOSIS — G932 Benign intracranial hypertension: Secondary | ICD-10-CM | POA: Diagnosis present

## 2022-01-10 DIAGNOSIS — Z91014 Allergy to mammalian meats: Secondary | ICD-10-CM

## 2022-01-10 DIAGNOSIS — G40901 Epilepsy, unspecified, not intractable, with status epilepticus: Secondary | ICD-10-CM | POA: Diagnosis not present

## 2022-01-10 DIAGNOSIS — F1210 Cannabis abuse, uncomplicated: Secondary | ICD-10-CM | POA: Diagnosis present

## 2022-01-10 DIAGNOSIS — Z832 Family history of diseases of the blood and blood-forming organs and certain disorders involving the immune mechanism: Secondary | ICD-10-CM

## 2022-01-10 DIAGNOSIS — G40409 Other generalized epilepsy and epileptic syndromes, not intractable, without status epilepticus: Principal | ICD-10-CM | POA: Diagnosis present

## 2022-01-10 DIAGNOSIS — F17210 Nicotine dependence, cigarettes, uncomplicated: Secondary | ICD-10-CM | POA: Diagnosis present

## 2022-01-10 DIAGNOSIS — J9811 Atelectasis: Secondary | ICD-10-CM | POA: Diagnosis present

## 2022-01-10 DIAGNOSIS — Z79899 Other long term (current) drug therapy: Secondary | ICD-10-CM

## 2022-01-10 DIAGNOSIS — F17290 Nicotine dependence, other tobacco product, uncomplicated: Secondary | ICD-10-CM | POA: Diagnosis present

## 2022-01-10 DIAGNOSIS — Z1152 Encounter for screening for COVID-19: Secondary | ICD-10-CM

## 2022-01-10 DIAGNOSIS — Z91138 Patient's unintentional underdosing of medication regimen for other reason: Secondary | ICD-10-CM

## 2022-01-10 DIAGNOSIS — T426X6A Underdosing of other antiepileptic and sedative-hypnotic drugs, initial encounter: Secondary | ICD-10-CM | POA: Diagnosis present

## 2022-01-10 LAB — COMPREHENSIVE METABOLIC PANEL
ALT: 14 U/L (ref 0–44)
AST: 25 U/L (ref 15–41)
Albumin: 4.3 g/dL (ref 3.5–5.0)
Alkaline Phosphatase: 98 U/L (ref 38–126)
Anion gap: 14 (ref 5–15)
BUN: 9 mg/dL (ref 6–20)
CO2: 20 mmol/L — ABNORMAL LOW (ref 22–32)
Calcium: 9.2 mg/dL (ref 8.9–10.3)
Chloride: 107 mmol/L (ref 98–111)
Creatinine, Ser: 1.2 mg/dL (ref 0.61–1.24)
GFR, Estimated: >60 mL/min (ref >60)
Glucose, Bld: 118 mg/dL — ABNORMAL HIGH (ref 70–99)
Potassium: 3.5 mmol/L (ref 3.5–5.1)
Sodium: 141 mmol/L (ref 135–145)
Total Bilirubin: 0.4 mg/dL (ref 0.3–1.2)
Total Protein: 7.3 g/dL (ref 6.5–8.1)

## 2022-01-10 LAB — CBC WITH DIFFERENTIAL/PLATELET
Abs Immature Granulocytes: 0.02 10*3/uL (ref 0.00–0.07)
Basophils Absolute: 0 10*3/uL (ref 0.0–0.1)
Basophils Relative: 0 %
Eosinophils Absolute: 0.1 10*3/uL (ref 0.0–0.5)
Eosinophils Relative: 2 %
HCT: 41.1 % (ref 39.0–52.0)
Hemoglobin: 13.3 g/dL (ref 13.0–17.0)
Immature Granulocytes: 0 %
Lymphocytes Relative: 41 %
Lymphs Abs: 2.5 10*3/uL (ref 0.7–4.0)
MCH: 30.6 pg (ref 26.0–34.0)
MCHC: 32.4 g/dL (ref 30.0–36.0)
MCV: 94.5 fL (ref 80.0–100.0)
Monocytes Absolute: 0.5 10*3/uL (ref 0.1–1.0)
Monocytes Relative: 9 %
Neutro Abs: 2.8 10*3/uL (ref 1.7–7.7)
Neutrophils Relative %: 48 %
Platelets: 242 10*3/uL (ref 150–400)
RBC: 4.35 MIL/uL (ref 4.22–5.81)
RDW: 15.5 % (ref 11.5–15.5)
WBC: 6 10*3/uL (ref 4.0–10.5)
nRBC: 0 % (ref 0.0–0.2)

## 2022-01-10 LAB — ETHANOL: Alcohol, Ethyl (B): <10 mg/dL (ref <10)

## 2022-01-10 LAB — VALPROIC ACID LEVEL: Valproic Acid Lvl: <10 ug/mL — ABNORMAL LOW (ref 50.0–100.0)

## 2022-01-10 MED ORDER — TOPIRAMATE 25 MG PO TABS: 25.0000 mg | ORAL_TABLET | Freq: Two times a day (BID) | ORAL | Status: DC

## 2022-01-10 MED ORDER — SODIUM CHLORIDE 0.9 % IV SOLN
200.0000 mg | Freq: Once | INTRAVENOUS | Status: AC
  Administered 2022-01-10: 200 mg via INTRAVENOUS
  Filled 2022-01-10: qty 20

## 2022-01-10 MED ORDER — OXCARBAZEPINE 300 MG PO TABS
1200.0000 mg | ORAL_TABLET | Freq: Two times a day (BID) | ORAL | Status: DC
  Administered 2022-01-11 (×2): 1200 mg via NASOGASTRIC
  Filled 2022-01-10 (×2): qty 4

## 2022-01-10 MED ORDER — LORAZEPAM 2 MG/ML IJ SOLN
INTRAMUSCULAR | Status: AC
  Administered 2022-01-10: 2 mg via INTRAVENOUS
  Filled 2022-01-10: qty 1

## 2022-01-10 MED ORDER — LORAZEPAM 2 MG/ML IJ SOLN: 2.0000 mg | Freq: Once | INTRAMUSCULAR | Status: AC

## 2022-01-10 MED ORDER — LEVETIRACETAM IN NACL 1000 MG/100ML IV SOLN
1000.0000 mg | Freq: Once | INTRAVENOUS | Status: AC
  Administered 2022-01-10: 1000 mg via INTRAVENOUS

## 2022-01-10 MED ORDER — LEVETIRACETAM IN NACL 1000 MG/100ML IV SOLN
1000.0000 mg | Freq: Once | INTRAVENOUS | Status: AC
  Filled 2022-01-10: qty 100

## 2022-01-10 MED ORDER — OXCARBAZEPINE 300 MG PO TABS: 1200.0000 mg | ORAL_TABLET | Freq: Two times a day (BID) | ORAL | Status: DC

## 2022-01-10 MED ORDER — TOPIRAMATE 25 MG PO TABS
25.0000 mg | ORAL_TABLET | Freq: Two times a day (BID) | ORAL | Status: DC
  Administered 2022-01-11 (×2): 25 mg via NASOGASTRIC
  Filled 2022-01-10 (×2): qty 1

## 2022-01-10 MED ORDER — LEVETIRACETAM IN NACL 1500 MG/100ML IV SOLN
1500.0000 mg | Freq: Two times a day (BID) | INTRAVENOUS | Status: DC
  Administered 2022-01-11 (×3): 1500 mg via INTRAVENOUS
  Filled 2022-01-10 (×4): qty 100

## 2022-01-10 MED ORDER — LEVETIRACETAM IN NACL 1000 MG/100ML IV SOLN
INTRAVENOUS | Status: AC
  Administered 2022-01-10: 1000 mg via INTRAVENOUS
  Filled 2022-01-10: qty 100
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
## 2022-01-10 NOTE — ED Provider Notes (Signed)
11:53 PM Assumed care from Dr. Matilde Sprang, please see their note for full history, physical and decision making until this point. In brief this is a 46 y.o. year old male who presented to the ED tonight with Seizures     Seizures. Likely noncompliant. Neuro involved, recommendations in chart. Recommending EEG, obs.   Discussed with Dr. Linda Hedges, will admit.   Labs, studies and imaging reviewed by myself and considered in medical decision making if ordered. Imaging interpreted by radiology.  Labs Reviewed  COMPREHENSIVE METABOLIC PANEL - Abnormal; Notable for the following components:      Result Value   CO2 20 (*)    Glucose, Bld 118 (*)    All other components within normal limits  VALPROIC ACID LEVEL - Abnormal; Notable for the following components:   Valproic Acid Lvl <10 (*)    All other components within normal limits  RESP PANEL BY RT-PCR (FLU A&B, COVID) ARPGX2  CBC WITH DIFFERENTIAL/PLATELET  ETHANOL  RAPID URINE DRUG SCREEN, HOSP PERFORMED  LEVETIRACETAM LEVEL  CARBAMAZEPINE, FREE AND TOTAL  TOPIRAMATE LEVEL    CT Head Wo Contrast  Final Result    DG Chest Portable 1 View  Final Result      No follow-ups on file.    Brian Ward, Corene Cornea, MD 01/11/22 [phone]

## 2022-01-10 NOTE — ED Notes (Signed)
Neuro at bedside attempting to wake pt up at this time

## 2022-01-10 NOTE — ED Notes (Signed)
Neuro at bedside at this time.

## 2022-01-10 NOTE — ED Notes (Signed)
Portable xray at bedside.

## 2022-01-10 NOTE — ED Notes (Signed)
Neuro attempting to wake pt up at this time

## 2022-01-10 NOTE — ED Triage Notes (Signed)
BIB [facility] from home c/o seizure. Family described a grand mal seizure that lasted approximately 5 min. Recovered briefly then had a second seizure that lasted a couple of min. Hx of seizures takes keppra and is compliant. At this time A&O x 3 pt was incontinent.

## 2022-01-10 NOTE — Consult Note (Addendum)
NEUROLOGY CONSULTATION NOTE   Date of service: January 10, 2022 Patient Name: Brian Ward MRN:  [id number] DOB:  December 04, 1975 Reason for consult: "multiple seizures" Requesting Provider: Teressa Lower, MD _ _ _   _ __   _ __ _ _  __ __   _ __   __ _  History of Present Illness  Brian Ward is a 46 y.o. male with PMH significant for seizures on Keppra, Trileptal and Topamax who had a 5 mins GTC at home and then an additional seizure at home. He was following commands for EMS and was brought in to the ED. In the ED, he was following commands and answering questions and then had another 3 mins GTC seizure in the ED. He got Ativan '4mg'$  and Keppra load 2G in the ED. Mentation is obtunded at this time. Unable to obtain any history from patient as he is post ictal and difficult to arouse.  Neurology consulted for assistance with seizure management.  On my eval, patient is obtunded. Spoke with fiancee at bedside, reports that he is compliant with his seizure meds the best she can tell. No recent illness.   ROS   Unable to assess due to obtunded mentation.  Past History   Past Medical History:  Diagnosis Date   Seizures ([place])    Past Surgical History:  Procedure Laterality Date   CARDIAC SURGERY     46 yrs old   Family History  Problem Relation Age of Onset   Seizures Maternal Aunt    Sickle cell anemia Maternal Aunt    Diabetes Mother    Social History   Socioeconomic History   Marital status: Legally Separated    Spouse name: Not on file   Number of children: 5   Years of education: Not on file   Highest education level: Not on file  Occupational History   Occupation: Unemployed  Tobacco Use   Smoking status: Every Day    Packs/day: 0.25    Types: Cigars, Cigarettes   Smokeless tobacco: Never   Tobacco comments:    smokes marijuana occasionally cigars every day  Vaping Use   Vaping Use: Never used  Substance and Sexual Activity   Alcohol use: Yes   Drug use: Not  Currently    Types: Marijuana    Comment: occas   Sexual activity: Not Currently  Other Topics Concern   Not on file  Social History Narrative   ** Merged History Encounter **    Right handed    Lives with family  apartment on second story   Drinks Caffeine    Social Determinants of Health   Financial Resource Strain: Not on file  Food Insecurity: Not on file  Transportation Needs: Not on file  Physical Activity: Not on file  Stress: Not on file  Social Connections: Not on file   Allergies  Allergen Reactions   Divalproex Sodium Other (See Comments)    The patient said it made him feel uneasy and not like himself   Pork-Derived Products Other (See Comments)     Prefers to not eat pork- "not good for me"    Medications  (Not in a hospital admission)    Vitals   Vitals:   01/10/22 2136 01/10/22 2137 01/10/22 2145 01/10/22 2200  BP:  111/71 103/62 132/64  Pulse:  85  (!) 119  Resp:  14 12 (!) 21  Temp: 98 F (36.7 C)     TempSrc: Oral  SpO2:  98%  99%     There is no height or weight on file to calculate BMI.  Physical Exam   General: Laying comfortably in bed; in no acute distress.  HENT: Normal oropharynx and mucosa. Normal external appearance of ears and nose.  Neck: Supple, no pain or tenderness  CV: No JVD. No peripheral edema.  Pulmonary: Symmetric Chest rise. Normal respiratory effort.  Abdomen: Soft to touch, non-tender.  Ext: No cyanosis, edema, or deformity  Skin: No rash. Normal palpation of skin.   Musculoskeletal: Normal digits and nails by inspection. No clubbing.   Neurologic Examination  Mental status/Cognition: grimaces to noxious stimuli. Does not open eyes to voice or to loud clap or to noxious stimuli Speech/language: obtunded, mute, no attempts to communicate. Cranial nerves:   CN II Pupils equal and reactive to light, unable  to assess for VF deficit.   CN III,IV,VI EOM intact to dolls eyes, dysconjugate gaze   CN V Corneals  intact BL   CN VII Symmetric facial grimace   CN VIII Does not turn head towards speech   CN IX & X Seems to be barely protecting his airway. Clenches his teeth when attempting to elicit cough or gag.   CN XI Mild left head lean.   CN XII midline tongue but does not protrude on command.   Motor/sensory:  Muscle bulk: normal, tone flaccid in all extremities Localizes in RUE but no movement in any of the other extremities noted.  Coordination/Complex Motor:  Unable to assess.  Labs   CBC:  Recent Labs  Lab 01/10/22 2141  WBC 6.0  NEUTROABS 2.8  HGB 13.3  HCT 41.1  MCV 94.5  PLT 027    Basic Metabolic Panel:  Lab Results  Component Value Date   NA 137 07/13/2021   K 3.6 07/13/2021   CO2 16 (L) 07/13/2021   GLUCOSE 160 (H) 07/13/2021   BUN 9 07/13/2021   CREATININE 1.23 07/13/2021   CALCIUM 9.7 07/13/2021   GFRNONAA >60 07/13/2021   GFRAA >60 08/01/2019   Lipid Panel: No results found for: "LDLCALC" HgbA1c:  Lab Results  Component Value Date   HGBA1C 6.2 (H) 03/24/2020   Urine Drug Screen:     Component Value Date/Time   LABOPIA NONE DETECTED 07/01/2020 1751   COCAINSCRNUR NONE DETECTED 07/01/2020 1751   LABBENZ NONE DETECTED 07/01/2020 1751   AMPHETMU NONE DETECTED 07/01/2020 1751   THCU POSITIVE (A) 07/01/2020 1751   LABBARB NONE DETECTED 07/01/2020 1751    Alcohol Level     Component Value Date/Time   ETH <10 06/08/2021 1826    CT Head without contrast(Personally reviewed): CTH was negative for a large hypodensity concerning for a large territory infarct or hyperdensity concerning for an ICH  cEEG:  pending  Impression   Brian Ward is a 46 y.o. male with PMH significant for seizures on Keppra, Trileptal and Topamax who presents with seizure clustering with 3 breakthrough seizures. Per fiancee, compliant with meds the best she can tell. No recent illness.  He is obtunded on evaluation, likely post ictal and sedation from Ativan. No clinical  evidence of seizure at this time. Will hook him up to cEEG given obtunded mentation to evaluate for subclinical seizures.   Recommendations  - Agree with Kepra load of 2G BID - Ordered Vimpat '200mg'$  IV once - Recommend NG tube and continuing home Topiramate and Trileptal. - increased home Keppra to 1500 BID - CBC, chemistry, UDS. - Leviteracetam levels,  topiramate levels and Oxcarbazepine levels.ordered. - cEEG to evaluate for subclinical seizures. However, if he does seem to wake up prior to being hooked up, will hold off on it. ______________________________________________________________________  This patient is critically ill and at significant risk of neurological worsening, death and care requires constant monitoring of vital signs, hemodynamics,respiratory and cardiac monitoring, neurological assessment, discussion with family, other specialists and medical decision making of high complexity. I spent 35 minutes of neurocritical care time  in the care of  this patient. This was time spent independent of any time provided by nurse practitioner or PA.  Donnetta Simpers [facility] Pager Number [phone] 01/10/2022  10:56 PM   Thank you for the opportunity to take part in the care of this patient. If you have any further questions, please contact the neurology consultation attending.  Signed,  [place] Pager Number [phone] _ _ _   _ __   _ __ _ _  __ __   _ __   __ _   Update: 11:54 PM On repeat evaluation, he is still somnolent and obtunded mentation. EEG tech at bedside to hook him up.

## 2022-01-10 NOTE — ED Notes (Signed)
Visitor at bedside out of room stating pt is having a seizure Entered room and found pt in an active grand mal seizure at this time Dr Matilde Sprang aware and at bedside received verbal orders for meds at this time. Pt seizure lasted approximately 70mn pt came out of seizure however still had tonic clonic motion for approximately 1-2 min Pt was turned on lt side, suctioned for vomiting, NRB 15lpm O2 at this time as well

## 2022-01-10 NOTE — ED Notes (Signed)
Dr Matilde Sprang at bedside

## 2022-01-10 NOTE — ED Notes (Signed)
ED provider at bedside at this time pt transitioned from nrb to [place] at 2lpm at this time

## 2022-01-11 ENCOUNTER — Encounter (HOSPITAL_COMMUNITY): Payer: Self-pay | Admitting: Internal Medicine

## 2022-01-11 ENCOUNTER — Inpatient Hospital Stay (HOSPITAL_COMMUNITY): Payer: Commercial Managed Care - HMO

## 2022-01-11 ENCOUNTER — Emergency Department (HOSPITAL_COMMUNITY): Payer: Commercial Managed Care - HMO

## 2022-01-11 DIAGNOSIS — Z79899 Other long term (current) drug therapy: Secondary | ICD-10-CM | POA: Diagnosis not present

## 2022-01-11 DIAGNOSIS — G932 Benign intracranial hypertension: Secondary | ICD-10-CM | POA: Diagnosis present

## 2022-01-11 DIAGNOSIS — R569 Unspecified convulsions: Secondary | ICD-10-CM | POA: Diagnosis not present

## 2022-01-11 DIAGNOSIS — F17210 Nicotine dependence, cigarettes, uncomplicated: Secondary | ICD-10-CM | POA: Diagnosis present

## 2022-01-11 DIAGNOSIS — J9811 Atelectasis: Secondary | ICD-10-CM | POA: Diagnosis present

## 2022-01-11 DIAGNOSIS — Z91138 Patient's unintentional underdosing of medication regimen for other reason: Secondary | ICD-10-CM | POA: Diagnosis not present

## 2022-01-11 DIAGNOSIS — F445 Conversion disorder with seizures or convulsions: Secondary | ICD-10-CM

## 2022-01-11 DIAGNOSIS — Z91014 Allergy to mammalian meats: Secondary | ICD-10-CM | POA: Diagnosis not present

## 2022-01-11 DIAGNOSIS — Z832 Family history of diseases of the blood and blood-forming organs and certain disorders involving the immune mechanism: Secondary | ICD-10-CM | POA: Diagnosis not present

## 2022-01-11 DIAGNOSIS — F17290 Nicotine dependence, other tobacco product, uncomplicated: Secondary | ICD-10-CM | POA: Diagnosis present

## 2022-01-11 DIAGNOSIS — G40901 Epilepsy, unspecified, not intractable, with status epilepticus: Secondary | ICD-10-CM | POA: Diagnosis present

## 2022-01-11 DIAGNOSIS — Z1152 Encounter for screening for COVID-19: Secondary | ICD-10-CM | POA: Diagnosis not present

## 2022-01-11 DIAGNOSIS — G40409 Other generalized epilepsy and epileptic syndromes, not intractable, without status epilepticus: Secondary | ICD-10-CM | POA: Diagnosis present

## 2022-01-11 DIAGNOSIS — Q018 Encephalocele of other sites: Secondary | ICD-10-CM | POA: Diagnosis not present

## 2022-01-11 DIAGNOSIS — H510 Palsy (spasm) of conjugate gaze: Secondary | ICD-10-CM | POA: Diagnosis present

## 2022-01-11 DIAGNOSIS — T426X6A Underdosing of other antiepileptic and sedative-hypnotic drugs, initial encounter: Secondary | ICD-10-CM | POA: Diagnosis present

## 2022-01-11 DIAGNOSIS — F1210 Cannabis abuse, uncomplicated: Secondary | ICD-10-CM | POA: Diagnosis present

## 2022-01-11 DIAGNOSIS — Z888 Allergy status to other drugs, medicaments and biological substances status: Secondary | ICD-10-CM | POA: Diagnosis not present

## 2022-01-11 DIAGNOSIS — Z91199 Patient's noncompliance with other medical treatment and regimen due to unspecified reason: Secondary | ICD-10-CM | POA: Diagnosis not present

## 2022-01-11 LAB — RAPID URINE DRUG SCREEN, HOSP PERFORMED
Amphetamines: NOT DETECTED
Barbiturates: NOT DETECTED
Benzodiazepines: POSITIVE — AB
Cocaine: NOT DETECTED
Opiates: NOT DETECTED
Tetrahydrocannabinol: POSITIVE — AB

## 2022-01-11 LAB — RESP PANEL BY RT-PCR (FLU A&B, COVID) ARPGX2
Influenza A by PCR: NEGATIVE
Influenza B by PCR: NEGATIVE
SARS Coronavirus 2 by RT PCR: NEGATIVE

## 2022-01-11 LAB — MAGNESIUM: Magnesium: 2.1 mg/dL (ref 1.7–2.4)

## 2022-01-11 LAB — HIV ANTIBODY (ROUTINE TESTING W REFLEX): HIV Screen 4th Generation wRfx: NONREACTIVE

## 2022-01-11 MED ORDER — OXCARBAZEPINE 300 MG PO TABS
1200.0000 mg | ORAL_TABLET | Freq: Two times a day (BID) | ORAL | Status: DC
  Administered 2022-01-11 – 2022-01-12 (×2): 1200 mg via ORAL
  Filled 2022-01-11 (×3): qty 4

## 2022-01-11 MED ORDER — ENOXAPARIN SODIUM 40 MG/0.4ML IJ SOSY
40.0000 mg | PREFILLED_SYRINGE | INTRAMUSCULAR | Status: DC
  Administered 2022-01-11 – 2022-01-12 (×2): 40 mg via SUBCUTANEOUS
  Filled 2022-01-11 (×2): qty 0.4

## 2022-01-11 MED ORDER — TOPIRAMATE 25 MG PO TABS
25.0000 mg | ORAL_TABLET | Freq: Two times a day (BID) | ORAL | Status: DC
  Administered 2022-01-11 – 2022-01-12 (×2): 25 mg via ORAL
  Filled 2022-01-11 (×2): qty 1

## 2022-01-11 MED ORDER — LEVETIRACETAM 500 MG PO TABS: 1000.0000 mg | ORAL_TABLET | Freq: Two times a day (BID) | ORAL | Status: DC

## 2022-01-11 MED ORDER — ORAL CARE MOUTH RINSE: 15.0000 mL | OROMUCOSAL | Status: DC | PRN

## 2022-01-11 MED ORDER — ORAL CARE MOUTH RINSE
15.0000 mL | OROMUCOSAL | Status: DC
  Administered 2022-01-11 – 2022-01-12 (×3): 15 mL via OROMUCOSAL

## 2022-01-11 MED ORDER — SODIUM CHLORIDE 0.9 % IV SOLN
75.0000 mL/h | INTRAVENOUS | Status: DC
  Administered 2022-01-11: 75 mL/h via INTRAVENOUS

## 2022-01-11 NOTE — Progress Notes (Signed)
PROGRESS NOTE    Brian Ward  XBW:[id number] DOB: 03-29-1975 DOA: 01/10/2022 PCP: Pcp, No    Brief Narrative:   Brian Ward is a 46 y.o. male with past medical history significant for seizure disorder who presented to [facility] ED on 12/5 via EMS for seizure activity.  Family reports seizure lasting approximately 5 minutes in which he briefly recovered and subsequently had a second seizure that lasted a few minutes.  Patient was incontinent during event.  Currently on Vimpat, Keppra and Topamax outpatient.  He is followed by neurology, Dr. Delice Lesch.  Patient reports that he is compliant with his therapy although previous notes note noncompliance; as he has had multiple ED visits for seizure and medication issues.  He is seen by neurology intermittently but gets lost to follow-up given his moving from location to location.  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  In the ED, afebrile, BP 106/68, HR was 78, RR 13.  Per EDP note, patient was alert, awake and conversant on presentation but had another witnessed seizure in the ED and was given 1 g IV Keppra x 2, IV Ativan 2 mg Maggie Font to and has been somnolent since.  Sodium 141, potassium 3.5, chloride 107, CO2 20, glucose 118, BUN 9, creatinine 1.20.  AST 25, ALT 14, total bilirubin 0.4.  WBC 6.0, hemoglobin 13.3, platelets 242.  Valproic acid less than 10.  COVID-19 PCR negative for influenza A/B PCR negative.  EtOH level less than 10.  UDS positive for THC and benzos.  CT head without contrast with no acute intracranial hemorrhage or infarct, empty expanded sella and dilation of the optic nerves, extensive opacification of left mastoid cells and middle ear cavity.  Chest x-Noviello with mild left basilar atelectasis versus infiltrate. Neurology was  consulted and recommended medication changes, NG tube placement for delivery of medication and admission to the hospitalist service for close monitoring.  He also ordered continuous EEG monitoring.  Assessment & Plan:   Epilepsy with breakthrough seizures Patient presenting to ED with clustering of 3 breakthrough seizures at home.  Per family he is compliant with medications but given previous hospitalizations/ED visits with recurrence of seizures and poor follow-up, suspect noncompliance.  Valproic acid level less than 10.  UDS positive for THC.  Patient was given loading dose of IV Keppra, IV Ativan.  Patient was placed on continuous EEG that was within normal limits. --Neurology following, appreciate assistance --Keppra increased to '1500mg'$  IV BID -- Trileptal 1200 mg p.o. twice daily -- Topamax 25 mg p.o. twice daily -- Seizure/aspiration precautions, supportive care   DVT prophylaxis: enoxaparin (LOVENOX) injection 40 mg Start: 01/11/22 0800    Code Status: Full Code Family Communication:   Disposition Plan:  Level of care: Progressive Status is: Inpatient Remains inpatient appropriate because: IV Keppra, needs improvement in mentation before ready for discharge home, anticipate discharge in 1-2 days    Consultants:  Neurology  Procedures:  EEG  Antimicrobials:  None   Subjective: Patient seen examined bedside, resting comfortably.  Lying in bed.  Sleeping but easily arousable.  Continues on LTM EEG.  NG tube placed due to somnolence following IV Ativan/Keppra load yesterday.  Seems more awake, requesting removal of NG tube.  Seen by speech therapy and okay for diet.  Awaiting further neurology recommendations.  Family present at bedside updated.  No other complaints or concerns at this time.  Patient denies headache, no dizziness, no fever/chills/night sweats, no nausea/vomiting/diarrhea, no chest pain, no palpitations, no shortness of breath, no abdominal pain, no focal  weakness, no fatigue, no paresthesias.  Objective: Vitals:   01/11/22 1045 01/11/22 1050 01/11/22 1315 01/11/22 1524  BP: (!) 121/90  130/80 122/82  Pulse: 68  71 77  Resp: '18  13 14  '$ Temp:  97.6 F (36.4 C)  97.8 F (36.6 C)  TempSrc:  Temporal  Oral  SpO2: 100%  97% 99%    Intake/Output Summary (Last 24 hours) at 01/11/2022 1720 Last data filed at 01/11/2022 1021 Gross per 24 hour  Intake 390.45 ml  Output --  Net 390.45 ml   There were no vitals filed for this visit.  Examination:  Physical Exam: GEN: NAD, alert and oriented x 3, chronically ill appearance, appears older than stated age 48: NCAT, PERRL, EOMI, sclera clear, MMM, NG tube noted in place PULM: CTAB w/o wheezes/crackles, normal respiratory effort, on room air CV: RRR w/o M/G/R GI: abd soft, NTND, NABS, no R/G/M MSK: no peripheral edema, muscle strength globally intact 5/5 bilateral upper/lower extremities NEURO: CN II-XII intact, no focal deficits, sensation to light touch intact PSYCH: normal mood/affect Integumentary: dry/intact, no rashes or wounds    Data Reviewed: I have personally reviewed following labs and imaging studies  CBC: Recent Labs  Lab 01/10/22 2141  WBC 6.0  NEUTROABS 2.8  HGB 13.3  HCT 41.1  MCV 94.5  PLT 756   Basic Metabolic Panel: Recent Labs  Lab 01/10/22 2141 01/11/22 0024  NA 141  --   K 3.5  --   CL 107  --   CO2 20*  --   GLUCOSE 118*  --   BUN 9  --   CREATININE 1.20  --   CALCIUM 9.2  --   MG  --  2.1   GFR: CrCl cannot be calculated (Unknown ideal weight.). Liver Function Tests: Recent Labs  Lab 01/10/22 2141  AST 25  ALT 14  ALKPHOS 98  BILITOT 0.4  PROT 7.3  ALBUMIN 4.3   No results for input(s): "LIPASE", "AMYLASE" in the last 168 hours. No results for input(s): "AMMONIA" in the last 168 hours. Coagulation Profile: No results for input(s): "INR", "PROTIME" in the last 168 hours. Cardiac Enzymes: No results for input(s): "CKTOTAL",  "CKMB", "CKMBINDEX", "TROPONINI" in the last 168 hours. BNP (last 3 results) No results for input(s): "PROBNP" in the last 8760 hours. HbA1C: No results for input(s): "HGBA1C" in the last 72 hours. CBG: No results for input(s): "GLUCAP" in the last 168 hours. Lipid Profile: No results for input(s): "CHOL", "HDL", "LDLCALC", "TRIG", "CHOLHDL", "LDLDIRECT" in the last 72 hours. Thyroid Function Tests: No results for input(s): "TSH", "T4TOTAL", "FREET4", "T3FREE", "THYROIDAB" in the last 72 hours. Anemia Panel: No results for input(s): "VITAMINB12", "FOLATE", "FERRITIN", "TIBC", "IRON", "RETICCTPCT" in the last 72 hours. Sepsis Labs: No results for input(s): "PROCALCITON", "LATICACIDVEN" in the last 168 hours.  Recent Results (from the past 240 hour(s))  Resp Panel by RT-PCR (Flu A&B, Covid) Anterior Nasal Swab     Status: None   Collection Time: 01/10/22  9:42 PM   Specimen: Anterior Nasal Swab  Result Value Ref Range Status   SARS Coronavirus 2 by RT PCR NEGATIVE NEGATIVE Final    Comment: (NOTE) SARS-CoV-2 target nucleic acids are NOT DETECTED.  The SARS-CoV-2 RNA is generally detectable in upper respiratory specimens during the  acute phase of infection. The lowest concentration of SARS-CoV-2 viral copies this assay can detect is 138 copies/mL. A negative result does not preclude SARS-Cov-2 infection and should not be used as the sole basis for treatment or other patient management decisions. A negative result may occur with  improper specimen collection/handling, submission of specimen other than nasopharyngeal swab, presence of viral mutation(s) within the areas targeted by this assay, and inadequate number of viral copies(<138 copies/mL). A negative result must be combined with clinical observations, patient history, and epidemiological information. The expected result is Negative.  Fact Sheet for Patients:  EntrepreneurPulse.com.au  Fact Sheet for  Healthcare Providers:  IncredibleEmployment.be  This test is no t yet approved or cleared by the [place] FDA and  has been authorized for detection and/or diagnosis of SARS-CoV-2 by FDA under an Emergency Use Authorization (EUA). This EUA will remain  in effect (meaning this test can be used) for the duration of the COVID-19 declaration under Section 564(b)(1) of the Act, 21 U.S.C.section 360bbb-3(b)(1), unless the authorization is terminated  or revoked sooner.       Influenza A by PCR NEGATIVE NEGATIVE Final   Influenza B by PCR NEGATIVE NEGATIVE Final    Comment: (NOTE) The Xpert Xpress SARS-CoV-2/FLU/RSV plus assay is intended as an aid in the diagnosis of influenza from Nasopharyngeal swab specimens and should not be used as a sole basis for treatment. Nasal washings and aspirates are unacceptable for Xpert Xpress SARS-CoV-2/FLU/RSV testing.  Fact Sheet for Patients: EntrepreneurPulse.com.au  Fact Sheet for Healthcare Providers: IncredibleEmployment.be  This test is not yet approved or cleared by the [place] FDA and has been authorized for detection and/or diagnosis of SARS-CoV-2 by FDA under an Emergency Use Authorization (EUA). This EUA will remain in effect (meaning this test can be used) for the duration of the COVID-19 declaration under Section 564(b)(1) of the Act, 21 U.S.C. section 360bbb-3(b)(1), unless the authorization is terminated or revoked.  Performed at [facility], [place] [address], [place], [place] [address]          Radiology Studies: Overnight EEG with video  Result Date: 01/11/2022 Samuella Cota, MD     01/11/2022  8:42 AM EEG Procedure CPT/Type of Study: 63149; 2-12hr EEG with video Referring Provider: [place] Primary Neurological Diagnosis: seizures History: This is a 46 yr old patient, undergoing an EEG to evaluate for seizures, status epilepticus. Clinical State:  disoriented Technical Description: The EEG was performed using standard setting per the guidelines of American Clinical Neurophysiology Society (ACNS). A minimum of 21 electrodes were placed on scalp according to the International 10-20 or/and 10-10 Systems. Supplemental electrodes were placed as needed. Single EKG electrode was also used to detect cardiac arrhythmia. Patient's behavior was continuously recorded on video simultaneously with EEG. A minimum of 16 channels were used for data display. Each epoch of study was reviewed manually daily and as needed using standard referential and bipolar montages. Computerized quantitative EEG analysis (such as compressed spectral array analysis, trending, automated spike & seizure detection) were used as indicated. Day 1: from 0116 01/11/22 to 0730 01/11/22 EEG Description: Overall Amplitude: Low Predominant Frequency: The background activity showed posterior dominant alpha, with about 9 Hz, that was rare. Superimposed Frequencies: occasional theta and some beta activity bilaterally The background was symmetric Background Abnormalities: None Rhythmic or periodic pattern: No Epileptiform activity: no Electrographic seizures: no Events: no Breach rhythm: no Reactivity: Present Stimulation procedures: Hyperventilation: not done Photic stimulation: no change Sleep Background: Stage  II EKG:no significant arrhythmia Impression: This was a continuous video EEG within limits of normal. Sleep comprised the majority of the recording. No seizures were seen.   DG Abdomen 1 View  Result Date: 01/11/2022 CLINICAL DATA:  Repositioning of NG tube. EXAM: ABDOMEN - 1 VIEW COMPARISON:  01/11/2022. FINDINGS: The bowel gas pattern is normal. The enteric tube terminates in the stomach and appears appropriate in position. Mild airspace disease is present at the left lung base. No radio-opaque calculi or other acute radiographic abnormality are seen. IMPRESSION: 1. Enteric tube terminates in  the stomach. 2. Stable airspace disease at the left lung base, possible atelectasis or infiltrate. Electronically Signed   By: Brett Fairy M.D.   On: 01/11/2022 01:38   DG Abdomen 1 View  Result Date: 01/11/2022 CLINICAL DATA:  NGT placement. EXAM: ABDOMEN - 1 VIEW COMPARISON:  None Available. FINDINGS: The stomach is distended with ingested debris. The side port of an enteric tube terminates at the gastroesophageal junction and should be advanced approximately 9 cm. The small bowel is not included in the field of view. Mild atelectasis is present at the left lung base and in the mid right lung. No acute osseous abnormality. Surgical clips are present in the right paratracheal space. IMPRESSION: 1. Side port of the enteric tube terminates at the gastroesophageal junction and should be advanced approximately 9 cm. 2. Mild atelectasis at the left lung base and mid right lung. Electronically Signed   By: Brett Fairy M.D.   On: 01/11/2022 00:36   CT Head Wo Contrast  Result Date: 01/10/2022 CLINICAL DATA:  Seizure disorder, clinical change EXAM: CT HEAD WITHOUT CONTRAST TECHNIQUE: Contiguous axial images were obtained from the base of the skull through the vertex without intravenous contrast. RADIATION DOSE REDUCTION: This exam was performed according to the departmental dose-optimization program which includes automated exposure control, adjustment of the mA and/or kV according to patient size and/or use of iterative reconstruction technique. COMPARISON:  06/08/2021 FINDINGS: Brain: Empty, expanded sella again noted, better assessed on MRI examination of 07/01/2020. Otherwise normal anatomic configuration of the brain. No abnormal intra or extra-axial mass lesion. No abnormal mass effect or midline shift. No acute intracranial hemorrhage or infarct. Ventricular size is normal. Cerebellum is unremarkable. Vascular: No hyperdense vessel or unexpected calcification. Skull: The there is deficiency involving the  dorsal cortex of the mastoid air cells better seen on coronal image # 46/5, better assessed on prior CT examination of 03/24/2020. No acute fracture. Sinuses/Orbits: The optic nerves appear thickened bilaterally likely representing dilation of the optic nerve sheaths better assessed on MRI examination of 07/01/2020. The orbits are otherwise unremarkable. Paranasal sinuses are clear. Other: There is extensive opacification of the left mastoid air cells and middle ear cavity, similar to multiple prior examinations. Right mastoid air cells and middle ear cavities are clear. IMPRESSION: 1. No acute intracranial hemorrhage or infarct 2. 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 Additional findings outlined on that examination suggest changes of intracranial hypertension. 3. Extensive opacification of the left mastoid air cells and middle ear cavity, possibly related to osseous deficiency involving the tegmen tympani and dorsal cortex of the mastoid air cells. This appears similar to multiple prior examinations. Electronically Signed   By: Fidela Salisbury M.D.   On: 01/10/2022 23:04   DG Chest Portable 1 View  Result Date: 01/10/2022 CLINICAL DATA:  Aspiration EXAM: PORTABLE CHEST 1 VIEW COMPARISON:  03/25/2020 FINDINGS: Mild  left basilar atelectasis or infiltrate. Lungs are otherwise clear. No pneumothorax or pleural effusion. Surgical clips are again noted within the right paratracheal region. Cardiac size within normal limits. Pulmonary vascularity is normal. No acute bone abnormality. IMPRESSION: 1. Mild left basilar atelectasis or infiltrate. Electronically Signed   By: Fidela Salisbury M.D.   On: 01/10/2022 22:19        Scheduled Meds:  enoxaparin (LOVENOX) injection  40 mg Subcutaneous Q24H   mouth rinse  15 mL Mouth Rinse Q2H   oxcarbazepine  1,200 mg Oral BID   topiramate  25 mg Oral BID   Continuous Infusions:  levETIRAcetam Stopped  (01/11/22 1021)     LOS: 0 days    Time spent: 45 minutes spent on chart review, discussion with nursing staff, consultants, updating family and interview/physical exam; more than 50% of that time was spent in counseling and/or coordination of care.    Chesley Veasey J [place] DO [facility] Available via Epic secure chat 7am-7pm After these hours, please refer to coverage provider listed on [email or web address] 01/11/2022, 5:20 PM

## 2022-01-11 NOTE — Procedures (Signed)
EEG Procedure CPT/Type of Study: [id number]; 2-12hr EEG with video Referring Provider: [place] Primary Neurological Diagnosis: seizures  History: This is a 46 yr old patient, undergoing an EEG to evaluate for seizures, status epilepticus. Clinical State: disoriented  Technical Description:  The EEG was performed using standard setting per the guidelines of American Clinical Neurophysiology Society (ACNS).  A minimum of 21 electrodes were placed on scalp according to the International 10-20 or/and 10-10 Systems. Supplemental electrodes were placed as needed. Single EKG electrode was also used to detect cardiac arrhythmia. Patient's behavior was continuously recorded on video simultaneously with EEG. A minimum of 16 channels were used for data display. Each epoch of study was reviewed manually daily and as needed using standard referential and bipolar montages. Computerized quantitative EEG analysis (such as compressed spectral array analysis, trending, automated spike & seizure detection) were used as indicated.   Day 1: from 0116 01/11/22 to 0730 01/11/22  EEG Description: Overall Amplitude: Low Predominant Frequency: The background activity showed posterior dominant alpha, with about 9 Hz, that was rare. Superimposed Frequencies: occasional theta and some beta activity bilaterally The background was symmetric  Background Abnormalities: None Rhythmic or periodic pattern: No Epileptiform activity: no Electrographic seizures: no Events: no   Breach rhythm: no  Reactivity: Present  Stimulation procedures:  Hyperventilation: not done Photic stimulation: no change  Sleep Background: Stage II  EKG:no significant arrhythmia  Impression: This was a continuous video EEG within limits of normal. Sleep comprised the majority of the recording. No seizures were seen.

## 2022-01-11 NOTE — H&P (Signed)
History and Physical    Brian Ward DPO:[id number] DOB: 1975-08-23 DOA: 01/10/2022  DOS: the patient was seen and examined on 01/10/2022  PCP: Pcp, No   Patient coming from: Home  I have personally briefly reviewed patient's old medical records in [place]  Brian Ward, a 46 y/o with a hx of seizure starting in 2015. First seizure in [place] and he evidently did requie intubtion. He has been seen by neurology intermittently but he gets lost to follow up with his moving from location to location, e.g. to [place], to [place], to [place] and back to [place].  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   He has had multiple ED visits for seizure and medication issues. He was seen by Dr. Delice Lesch, [facility] 11/29/21. HIs medications were reviewed and renewed: Currently on Vimpat, Keppra and topamax.   On the day of admission he had 3 break-thru seizures, a grand mal seizure witnessed lasting 5 minutes followed by additional seizure and presents to [facility] for evaluation and treatment.    ED Course: afebrile  106/68  HR 78  RR 13. Per EDP note -patient awake and conversant at presentation. He had a witness seizure in ED and was given 1g IV keppra x 2, IV ativan '2mg'$  x 2 and has been obtunded since. Lab revealed CO2 20, glucose 118, CBCD nl, Valproic acid <10. CXR w/ mild left basilar atelectasis. Dr. Lorrin Goodell, neurology, consulted. He recommended changes in mediation, NG placement for delivery of medication and recommended TRH admit to progressive care where the patient can be closely monitored. He also order continuous EEG monitoring.   Review of Systems:  Review of Systems  Unable to perform ROS: Patient unresponsive    Past Medical History:  Diagnosis Date   Seizures [facility])     Past Surgical  History:  Procedure Laterality Date   CARDIAC SURGERY     46 yrs old     reports that he has been smoking cigars and cigarettes. He has been smoking an average of .25 packs per day. He has never used smokeless tobacco. He reports current alcohol use. He reports that he does not currently use drugs after having used the following drugs: Marijuana.  Allergies  Allergen Reactions   Divalproex Sodium Other (See Comments)    The patient said it made him feel uneasy and not like himself   Pork-Derived Products Other (See Comments)     Prefers to not eat pork- "not good for me"    Family History  Problem Relation Age of Onset   Seizures Maternal Aunt    Sickle cell anemia Maternal Aunt    Diabetes Mother     Prior to Admission medications   Medication Sig Start Date End Date Taking? Authorizing Provider  amoxicillin (AMOXIL) 500 MG capsule Take 1 capsule (500 mg total) by mouth 3 (three) times daily. 11/22/21   Francene Finders, PA-C  levETIRAcetam (KEPPRA) 1000 MG tablet Take 1 tablet (1,000 mg total) by mouth 2 (two) times daily. 11/29/21   Cameron Sprang, MD  oxcarbazepine (TRILEPTAL) 600 MG tablet Take 2 tablets (1,200 mg total) by mouth 2 (two) times daily. 11/29/21 11/29/22  Cameron Sprang, MD  topiramate (TOPAMAX) 25 MG tablet Take 1 tablet (25 mg total) by mouth 2 (two) times daily. 11/29/21   Cameron Sprang,  MD    Physical Exam: Vitals:   01/10/22 2137 01/10/22 2145 01/10/22 2200 01/10/22 2345  BP: 111/71 103/62 132/64 106/68  Pulse: 85  (!) 119 78  Resp: 14 12 (!) 21 13  Temp:      TempSrc:      SpO2: 98%  99% 99%    Physical Exam Vitals and nursing note reviewed.  Constitutional:      Appearance: Normal appearance. He is normal weight.     Comments: Obtunded, minimal response to hard sternal rub.  HENT:     Head: Normocephalic and atraumatic.     Nose:     Comments: NG tube placed right nostril    Mouth/Throat:     Mouth: Mucous membranes are moist.  Eyes:      General: No scleral icterus.       Right eye: No discharge.        Left eye: No discharge.     Conjunctiva/sclera: Conjunctivae normal.     Pupils: Pupils are equal, round, and reactive to light.  Cardiovascular:     Rate and Rhythm: Normal rate and regular rhythm.     Pulses: Normal pulses.     Heart sounds: Normal heart sounds.  Pulmonary:     Effort: Pulmonary effort is normal.     Breath sounds: Rhonchi present.     Comments: Coarse rhonchi Abdominal:     General: Bowel sounds are normal. There is no distension.     Palpations: Abdomen is soft.     Tenderness: There is no guarding.  Musculoskeletal:        General: No swelling.     Cervical back: Neck supple. No rigidity.     Right lower leg: No edema.     Left lower leg: No edema.  Skin:    General: Skin is warm and dry.     Coloration: Skin is not jaundiced.     Findings: No lesion.  Neurological:     Comments: Obtunded. Pupils minimally reactive. No facial asymmetry. Poor muscle tone. Obtunded -did not follow commands. Reacts to pain. DTRs 2+ throughout      Labs on Admission: I have personally reviewed following labs and imaging studies  CBC: Recent Labs  Lab 01/10/22 2141  WBC 6.0  NEUTROABS 2.8  HGB 13.3  HCT 41.1  MCV 94.5  PLT 599   Basic Metabolic Panel: Recent Labs  Lab 01/10/22 2141  NA 141  K 3.5  CL 107  CO2 20*  GLUCOSE 118*  BUN 9  CREATININE 1.20  CALCIUM 9.2   GFR: CrCl cannot be calculated (Unknown ideal weight.). Liver Function Tests: Recent Labs  Lab 01/10/22 2141  AST 25  ALT 14  ALKPHOS 98  BILITOT 0.4  PROT 7.3  ALBUMIN 4.3   No results for input(s): "LIPASE", "AMYLASE" in the last 168 hours. No results for input(s): "AMMONIA" in the last 168 hours. Coagulation Profile: No results for input(s): "INR", "PROTIME" in the last 168 hours. Cardiac Enzymes: No results for input(s): "CKTOTAL", "CKMB", "CKMBINDEX", "TROPONINI" in the last 168 hours. BNP (last 3  results) No results for input(s): "PROBNP" in the last 8760 hours. HbA1C: No results for input(s): "HGBA1C" in the last 72 hours. CBG: No results for input(s): "GLUCAP" in the last 168 hours. Lipid Profile: No results for input(s): "CHOL", "HDL", "LDLCALC", "TRIG", "CHOLHDL", "LDLDIRECT" in the last 72 hours. Thyroid Function Tests: No results for input(s): "TSH", "T4TOTAL", "FREET4", "T3FREE", "THYROIDAB" in the last 72 hours.  Anemia Panel: No results for input(s): "VITAMINB12", "FOLATE", "FERRITIN", "TIBC", "IRON", "RETICCTPCT" in the last 72 hours. Urine analysis:    Component Value Date/Time   COLORURINE YELLOW 03/24/2020 1826   APPEARANCEUR CLEAR 03/24/2020 1826   LABSPEC 1.039 (H) 03/24/2020 1826   PHURINE 5.0 03/24/2020 1826   GLUCOSEU NEGATIVE 03/24/2020 1826   HGBUR NEGATIVE 03/24/2020 1826   BILIRUBINUR NEGATIVE 03/24/2020 1826   KETONESUR NEGATIVE 03/24/2020 1826   PROTEINUR 100 (A) 03/24/2020 1826   UROBILINOGEN 0.2 10/03/2015 1419   NITRITE NEGATIVE 03/24/2020 1826   LEUKOCYTESUR NEGATIVE 03/24/2020 1826    Radiological Exams on Admission: I have personally reviewed images DG Abdomen 1 View  Result Date: 01/11/2022 CLINICAL DATA:  NGT placement. EXAM: ABDOMEN - 1 VIEW COMPARISON:  None Available. FINDINGS: The stomach is distended with ingested debris. The side port of an enteric tube terminates at the gastroesophageal junction and should be advanced approximately 9 cm. The small bowel is not included in the field of view. Mild atelectasis is present at the left lung base and in the mid right lung. No acute osseous abnormality. Surgical clips are present in the right paratracheal space. IMPRESSION: 1. Side port of the enteric tube terminates at the gastroesophageal junction and should be advanced approximately 9 cm. 2. Mild atelectasis at the left lung base and mid right lung. Electronically Signed   By: Brett Fairy M.D.   On: 01/11/2022 00:36   CT Head Wo  Contrast  Result Date: 01/10/2022 CLINICAL DATA:  Seizure disorder, clinical change EXAM: CT HEAD WITHOUT CONTRAST TECHNIQUE: Contiguous axial images were obtained from the base of the skull through the vertex without intravenous contrast. RADIATION DOSE REDUCTION: This exam was performed according to the departmental dose-optimization program which includes automated exposure control, adjustment of the mA and/or kV according to patient size and/or use of iterative reconstruction technique. COMPARISON:  06/08/2021 FINDINGS: Brain: Empty, expanded sella again noted, better assessed on MRI examination of 07/01/2020. Otherwise normal anatomic configuration of the brain. No abnormal intra or extra-axial mass lesion. No abnormal mass effect or midline shift. No acute intracranial hemorrhage or infarct. Ventricular size is normal. Cerebellum is unremarkable. Vascular: No hyperdense vessel or unexpected calcification. Skull: The there is deficiency involving the dorsal cortex of the mastoid air cells better seen on coronal image # 46/5, better assessed on prior CT examination of 03/24/2020. No acute fracture. Sinuses/Orbits: The optic nerves appear thickened bilaterally likely representing dilation of the optic nerve sheaths better assessed on MRI examination of 07/01/2020. The orbits are otherwise unremarkable. Paranasal sinuses are clear. Other: There is extensive opacification of the left mastoid air cells and middle ear cavity, similar to multiple prior examinations. Right mastoid air cells and middle ear cavities are clear. IMPRESSION: 1. No acute intracranial hemorrhage or infarct 2. 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 Additional findings outlined on that examination suggest changes of intracranial hypertension. 3. Extensive opacification of the left mastoid air cells and middle ear cavity, possibly related to osseous deficiency involving the  tegmen tympani and dorsal cortex of the mastoid air cells. This appears similar to multiple prior examinations. Electronically Signed   By: Fidela Salisbury M.D.   On: 01/10/2022 23:04   DG Chest Portable 1 View  Result Date: 01/10/2022 CLINICAL DATA:  Aspiration EXAM: PORTABLE CHEST 1 VIEW COMPARISON:  03/25/2020 FINDINGS: Mild left basilar atelectasis or infiltrate. Lungs are otherwise clear. No pneumothorax or pleural effusion. Surgical clips  are again noted within the right paratracheal region. Cardiac size within normal limits. Pulmonary vascularity is normal. No acute bone abnormality. IMPRESSION: 1. Mild left basilar atelectasis or infiltrate. Electronically Signed   By: Fidela Salisbury M.D.   On: 01/10/2022 22:19    EKG: I have personally reviewed EKG: no EKG on chart  Assessment/Plan Principal Problem:   Seizure ([place])    Assessment and Plan: * Seizure [facility]) Patient with h/o seizures, medication adherence issues and multiple ED visits. He now presents with seizures x 3. Depakote level was undetectable. He was seen in consultation by DR. Khaliqdina, neurology, who recommended EEG, NG tube to insure delivery of medications  and adjusted medications doses.  Plan Inpatient care while getting new doses and while completing EEG  Admit to progressive care due to need for close observation - a risk for threatened airway 2/2 obtundation.  Neurology will follow.        DVT prophylaxis: Lovenox Code Status: Full Code Family Communication: family had been present earlier. Postponed contacting family due to the hour and now change in patients condition  Disposition Plan: TBD  Consults called: Neurology - Dr. Lorrin Goodell  Admission status: Inpatient, Step Down Unit   Adella Hare, MD [facility] 01/11/2022, 12:47 AM

## 2022-01-11 NOTE — Progress Notes (Signed)
Neurology Progress Note  Brief HPI: 46 y.o. male with PMH significant for seizures on Keppra, Trileptal and Topamax who had a 5 mins GTC at home and then an additional seizure at home. He was following commands for EMS and was brought in to the ED. In the ED, he was following commands and answering questions and then had another 3 mins GTC seizure in the ED. He got Ativan '4mg'$  and Keppra load 2G in the ED   Subjective: Patient seen in room and remains connect to EEG. He is drowsy but able to  answer orientation questions appropriately.   Exam: Vitals:   01/11/22 0615 01/11/22 0656  BP: (!) 122/98   Pulse: 71   Resp: 17   Temp:  98.5 F (36.9 C)  SpO2: 98%    Gen: In bed, NAD Resp: non-labored breathing, no acute distress Abd: soft, nt  Neuro: Mental Status: Drowsy, but oriented to self, place, and date. States he is here because he hasn't been feeling well. Speech is soft and mumbling.  No signs of aphasia or neglect Cranial Nerves: II: Visual Fields are full. PERRL.   III,IV, VI: EOM intact V: Facial sensation is symmetric to temperature VII: Facial movement is symmetric resting and smiling VIII: Hearing is intact to voice X: Palate elevates symmetrically XI: Shoulder shrug is symmetric. XII: Tongue protrudes midline without atrophy or fasciculations.  Motor: Tone is normal. Bulk is normal. 5/5 strength was present in all four extremities.  Sensory: Sensation is symmetric to light touch in the arms and legs.  Cerebellar: FNF intact bilaterally Gait: steady or deferred for safety    Pertinent Labs: UDS positive for Benzos and THC (received ativan prior to UDS in ED)  Imaging Reviewed: cEEG 0116 01/11/2022 - 0730 01/11/2022- This was a continuous video EEG within limits of normal. Sleep comprised the majority of the recording. No seizures were seen    Assessment: Brian Ward is a 46 y.o. male with PMH significant for seizures on Keppra, Trileptal and Topamax who presents  with seizure clustering with 3 breakthrough seizures. Per fiancee, compliant with meds the best she can tell. No recent illness.   Received  - Keppra load of 2G BID - Vimpat '200mg'$  IV once - Home Topiramate and Trileptal. - Keppra to 1500 BID   Recommendations: - Discontinue LTM - Continue Keppra '1500mg'$  BID - Continue home Topiramate and Trileptal   Patient seen and examined by NP/APP with MD. MD to update note as needed.   Janine Ores, DNP, FNP-BC [facility] Pager: [phone]  Neurology attending note  I went to see patient but he was OTF being transferred from ED. I agree with the findings and recommendations in NP note above. If he is back to baseline tmrw he can be discharged on current med regimen (no driving x6 mos).  Su Monks, MD [facility] [phone]  If 7pm- 7am, please page neurology on call as listed in [place]

## 2022-01-11 NOTE — ED Notes (Signed)
Continues to having snoring respirations. At bedside to adjust pt in bed and place condom cath While turning pt he did o;en eyes to say he needed to use the restroom pt was adjusted in bed and provided urinal at this time Once pt was finished he went back to sleep and condom cath was placed

## 2022-01-11 NOTE — ED Notes (Signed)
ED TO INPATIENT HANDOFF REPORT  ED Nurse Name and Phone #: Andee Poles [phone]  S Name/Age/Gender Brian Ward 46 y.o. male Room/Bed: 034C/034C  Code Status   Code Status: Full Code  Home/SNF/Other Home Patient oriented to: self, place, time, and situation Is this baseline? Yes   Triage Complete: Triage complete  Chief Complaint Seizure [facility]) [R56.9]  Triage Note BIB [facility] from home c/o seizure. Family described a grand mal seizure that lasted approximately 5 min. Recovered briefly then had a second seizure that lasted a couple of min. Hx of seizures takes keppra and is compliant. At this time A&O x 3 pt was incontinent.    Allergies Allergies  Allergen Reactions   Depakote [Divalproex Sodium] Other (See Comments)    The patient said it made him feel uneasy and not like himself   Pork-Derived Products Other (See Comments)     Prefers to not eat pork- "not good for me"    Level of Care/Admitting Diagnosis ED Disposition     ED Disposition  Admit   Condition  --   [place]: [place] [[id number]]  Level of Care: Progressive [102]  Admit to Progressive based on following criteria: NEUROLOGICAL AND NEUROSURGICAL complex patients with significant risk of instability, who do not meet ICU criteria, yet require close observation or frequent assessment (< / = every 2 - 4 hours) with medical / nursing intervention.  May admit patient to [facility] or [facility] if equivalent level of care is available:: No  Covid Evaluation: Asymptomatic - no recent exposure (last 10 days) testing not required  Diagnosis: Seizure ([place]) [[id number]]  Admitting Physician: Neena Rhymes [[id number]]  Attending Physician: Neena Rhymes [[id number]]  Certification:: I certify this patient will need inpatient services for at least 2 midnights  Estimated Length of Stay: 3          B Medical/Surgery History Past Medical History:  Diagnosis Date   Seizures ([place])     Past Surgical History:  Procedure Laterality Date   CARDIAC SURGERY     46 yrs old     A IV Location/Drains/Wounds Patient Lines/Drains/Airways Status     Active Line/Drains/Airways     Name Placement date Placement time Site Days   Peripheral IV 01/10/22 20 G Left Wrist 01/10/22  2125  Wrist  1   Peripheral IV 01/10/22 20 G Distal;Left;Posterior Forearm 01/10/22  2130  Forearm  1   External Urinary Catheter 01/11/22  0251  --  less than 1            Intake/Output Last 24 hours  Intake/Output Summary (Last 24 hours) at 01/11/2022 1612 Last data filed at 01/11/2022 1021 Gross per 24 hour  Intake 390.45 ml  Output --  Net 390.45 ml    Labs/Imaging Results for orders placed or performed during the hospital encounter of 01/10/22 (from the past 48 hour(s))  CBC with Differential     Status: None   Collection Time: 01/10/22  9:41 PM  Result Value Ref Range   WBC 6.0 4.0 - 10.5 K/uL   RBC 4.35 4.22 - 5.81 MIL/uL   Hemoglobin 13.3 13.0 - 17.0 g/dL   HCT 41.1 39.0 - 52.0 %   MCV 94.5 80.0 - 100.0 fL   MCH 30.6 26.0 - 34.0 pg   MCHC 32.4 30.0 - 36.0 g/dL   RDW 15.5 11.5 - 15.5 %   Platelets 242 150 - 400 K/uL   nRBC 0.0 0.0 -  0.2 %   Neutrophils Relative % 48 %   Neutro Abs 2.8 1.7 - 7.7 K/uL   Lymphocytes Relative 41 %   Lymphs Abs 2.5 0.7 - 4.0 K/uL   Monocytes Relative 9 %   Monocytes Absolute 0.5 0.1 - 1.0 K/uL   Eosinophils Relative 2 %   Eosinophils Absolute 0.1 0.0 - 0.5 K/uL   Basophils Relative 0 %   Basophils Absolute 0.0 0.0 - 0.1 K/uL   Immature Granulocytes 0 %   Abs Immature Granulocytes 0.02 0.00 - 0.07 K/uL    Comment: Performed at [facility] [address], [place], [place] [address]  Comprehensive metabolic panel     Status: Abnormal   Collection Time: 01/10/22  9:41 PM  Result Value Ref Range   Sodium 141 135 - 145 mmol/L   Potassium 3.5 3.5 - 5.1 mmol/L   Chloride 107 98 - 111 mmol/L   CO2 20 (L) 22 - 32 mmol/L   Glucose, Bld 118  (H) 70 - 99 mg/dL    Comment: Glucose reference range applies only to samples taken after fasting for at least 8 hours.   BUN 9 6 - 20 mg/dL   Creatinine, Ser 1.20 0.61 - 1.24 mg/dL   Calcium 9.2 8.9 - 10.3 mg/dL   Total Protein 7.3 6.5 - 8.1 g/dL   Albumin 4.3 3.5 - 5.0 g/dL   AST 25 15 - 41 U/L   ALT 14 0 - 44 U/L   Alkaline Phosphatase 98 38 - 126 U/L   Total Bilirubin 0.4 0.3 - 1.2 mg/dL   GFR, Estimated >60 >60 mL/min    Comment: (NOTE) Calculated using the CKD-EPI Creatinine Equation (2021)    Anion gap 14 5 - 15    Comment: Performed at [facility] [address] [place], [place] [address]  Valproic acid level     Status: Abnormal   Collection Time: 01/10/22  9:41 PM  Result Value Ref Range   Valproic Acid Lvl <10 (L) 50.0 - 100.0 ug/mL    Comment: RESULT CONFIRMED BY MANUAL DILUTION Performed at [facility] [address] [place], [place] [address]   Ethanol     Status: None   Collection Time: 01/10/22  9:41 PM  Result Value Ref Range   Alcohol, Ethyl (B) <10 <10 mg/dL    Comment: (NOTE) Lowest detectable limit for serum alcohol is 10 mg/dL.  For medical purposes only. Performed at [facility], [place] [address], [place], [place] [address]   Resp Panel by RT-PCR (Flu A&B, Covid) Anterior Nasal Swab     Status: None   Collection Time: 01/10/22  9:42 PM   Specimen: Anterior Nasal Swab  Result Value Ref Range   SARS Coronavirus 2 by RT PCR NEGATIVE NEGATIVE    Comment: (NOTE) SARS-CoV-2 target nucleic acids are NOT DETECTED.  The SARS-CoV-2 RNA is generally detectable in upper respiratory specimens during the acute phase of infection. The lowest concentration of SARS-CoV-2 viral copies this assay can detect is 138 copies/mL. A negative result does not preclude SARS-Cov-2 infection and should not be used as the sole basis for treatment or other patient management decisions. A negative result may occur with  improper specimen  collection/handling, submission of specimen other than nasopharyngeal swab, presence of viral mutation(s) within the areas targeted by this assay, and inadequate number of viral copies(<138 copies/mL). A negative result must be combined with clinical observations, patient history, and epidemiological information.  The expected result is Negative.  Fact Sheet for Patients:  EntrepreneurPulse.com.au  Fact Sheet for Healthcare Providers:  IncredibleEmployment.be  This test is no t yet approved or cleared by the [place] FDA and  has been authorized for detection and/or diagnosis of SARS-CoV-2 by FDA under an Emergency Use Authorization (EUA). This EUA will remain  in effect (meaning this test can be used) for the duration of the COVID-19 declaration under Section 564(b)(1) of the Act, 21 U.S.C.section 360bbb-3(b)(1), unless the authorization is terminated  or revoked sooner.       Influenza A by PCR NEGATIVE NEGATIVE   Influenza B by PCR NEGATIVE NEGATIVE    Comment: (NOTE) The Xpert Xpress SARS-CoV-2/FLU/RSV plus assay is intended as an aid in the diagnosis of influenza from Nasopharyngeal swab specimens and should not be used as a sole basis for treatment. Nasal washings and aspirates are unacceptable for Xpert Xpress SARS-CoV-2/FLU/RSV testing.  Fact Sheet for Patients: EntrepreneurPulse.com.au  Fact Sheet for Healthcare Providers: IncredibleEmployment.be  This test is not yet approved or cleared by the [place] FDA and has been authorized for detection and/or diagnosis of SARS-CoV-2 by FDA under an Emergency Use Authorization (EUA). This EUA will remain in effect (meaning this test can be used) for the duration of the COVID-19 declaration under Section 564(b)(1) of the Act, 21 U.S.C. section 360bbb-3(b)(1), unless the authorization is terminated or revoked.  Performed at [facility], [place] [address] [place], [place] [address]   HIV Antibody (routine testing w rflx)     Status: None   Collection Time: 01/11/22 12:24 AM  Result Value Ref Range   HIV Screen 4th Generation wRfx Non Reactive Non Reactive    Comment: Performed at [facility], [place] [address] [place], [place] [address]  Magnesium     Status: None   Collection Time: 01/11/22 12:24 AM  Result Value Ref Range   Magnesium 2.1 1.7 - 2.4 mg/dL    Comment: Performed at [facility], [place] [address], [place], [place] [address]  Rapid urine drug screen (hospital performed)     Status: Abnormal   Collection Time: 01/11/22  2:42 AM  Result Value Ref Range   Opiates NONE DETECTED NONE DETECTED   Cocaine NONE DETECTED NONE DETECTED   Benzodiazepines POSITIVE (A) NONE DETECTED   Amphetamines NONE DETECTED NONE DETECTED   Tetrahydrocannabinol POSITIVE (A) NONE DETECTED   Barbiturates NONE DETECTED NONE DETECTED    Comment: (NOTE) DRUG SCREEN FOR MEDICAL PURPOSES ONLY.  IF CONFIRMATION IS NEEDED FOR ANY PURPOSE, NOTIFY LAB WITHIN 5 DAYS.  LOWEST DETECTABLE LIMITS FOR URINE DRUG SCREEN Drug Class                     Cutoff (ng/mL) Amphetamine and metabolites    1000 Barbiturate and metabolites    200 Benzodiazepine                 200 Opiates and metabolites        300 Cocaine and metabolites        300 THC                            50 Performed at [facility], [place] [address] [place], [place] [address]    Overnight EEG with video  Result Date: 01/11/2022 Samuella Cota, MD     01/11/2022  8:42 AM EEG Procedure CPT/Type  of Study: [phone]; 2-12hr EEG with video Referring Provider: [place] Primary Neurological Diagnosis: seizures History: This is a 46 yr old patient, undergoing an EEG to evaluate for seizures, status epilepticus. Clinical State: disoriented Technical Description: The EEG was performed using standard setting per the guidelines of American Clinical Neurophysiology Society (ACNS). A  minimum of 21 electrodes were placed on scalp according to the International 10-20 or/and 10-10 Systems. Supplemental electrodes were placed as needed. Single EKG electrode was also used to detect cardiac arrhythmia. Patient's behavior was continuously recorded on video simultaneously with EEG. A minimum of 16 channels were used for data display. Each epoch of study was reviewed manually daily and as needed using standard referential and bipolar montages. Computerized quantitative EEG analysis (such as compressed spectral array analysis, trending, automated spike & seizure detection) were used as indicated. Day 1: from 0116 01/11/22 to 0730 01/11/22 EEG Description: Overall Amplitude: Low Predominant Frequency: The background activity showed posterior dominant alpha, with about 9 Hz, that was rare. Superimposed Frequencies: occasional theta and some beta activity bilaterally The background was symmetric Background Abnormalities: None Rhythmic or periodic pattern: No Epileptiform activity: no Electrographic seizures: no Events: no Breach rhythm: no Reactivity: Present Stimulation procedures: Hyperventilation: not done Photic stimulation: no change Sleep Background: Stage II EKG:no significant arrhythmia Impression: This was a continuous video EEG within limits of normal. Sleep comprised the majority of the recording. No seizures were seen.   DG Abdomen 1 View  Result Date: 01/11/2022 CLINICAL DATA:  Repositioning of NG tube. EXAM: ABDOMEN - 1 VIEW COMPARISON:  01/11/2022. FINDINGS: The bowel gas pattern is normal. The enteric tube terminates in the stomach and appears appropriate in position. Mild airspace disease is present at the left lung base. No radio-opaque calculi or other acute radiographic abnormality are seen. IMPRESSION: 1. Enteric tube terminates in the stomach. 2. Stable airspace disease at the left lung base, possible atelectasis or infiltrate. Electronically Signed   By: Brett Fairy M.D.   On:  01/11/2022 01:38   DG Abdomen 1 View  Result Date: 01/11/2022 CLINICAL DATA:  NGT placement. EXAM: ABDOMEN - 1 VIEW COMPARISON:  None Available. FINDINGS: The stomach is distended with ingested debris. The side port of an enteric tube terminates at the gastroesophageal junction and should be advanced approximately 9 cm. The small bowel is not included in the field of view. Mild atelectasis is present at the left lung base and in the mid right lung. No acute osseous abnormality. Surgical clips are present in the right paratracheal space. IMPRESSION: 1. Side port of the enteric tube terminates at the gastroesophageal junction and should be advanced approximately 9 cm. 2. Mild atelectasis at the left lung base and mid right lung. Electronically Signed   By: Brett Fairy M.D.   On: 01/11/2022 00:36   CT Head Wo Contrast  Result Date: 01/10/2022 CLINICAL DATA:  Seizure disorder, clinical change EXAM: CT HEAD WITHOUT CONTRAST TECHNIQUE: Contiguous axial images were obtained from the base of the skull through the vertex without intravenous contrast. RADIATION DOSE REDUCTION: This exam was performed according to the departmental dose-optimization program which includes automated exposure control, adjustment of the mA and/or kV according to patient size and/or use of iterative reconstruction technique. COMPARISON:  06/08/2021 FINDINGS: Brain: Empty, expanded sella again noted, better assessed on MRI examination of 07/01/2020. Otherwise normal anatomic configuration of the brain. No abnormal intra or extra-axial mass lesion. No abnormal mass effect or midline shift. No acute intracranial hemorrhage or infarct. Ventricular size  is normal. Cerebellum is unremarkable. Vascular: No hyperdense vessel or unexpected calcification. Skull: The there is deficiency involving the dorsal cortex of the mastoid air cells better seen on coronal image # 46/5, better assessed on prior CT examination of 03/24/2020. No acute fracture.  Sinuses/Orbits: The optic nerves appear thickened bilaterally likely representing dilation of the optic nerve sheaths better assessed on MRI examination of 07/01/2020. The orbits are otherwise unremarkable. Paranasal sinuses are clear. Other: There is extensive opacification of the left mastoid air cells and middle ear cavity, similar to multiple prior examinations. Right mastoid air cells and middle ear cavities are clear. IMPRESSION: 1. No acute intracranial hemorrhage or infarct 2. 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 Additional findings outlined on that examination suggest changes of intracranial hypertension. 3. Extensive opacification of the left mastoid air cells and middle ear cavity, possibly related to osseous deficiency involving the tegmen tympani and dorsal cortex of the mastoid air cells. This appears similar to multiple prior examinations. Electronically Signed   By: Fidela Salisbury M.D.   On: 01/10/2022 23:04   DG Chest Portable 1 View  Result Date: 01/10/2022 CLINICAL DATA:  Aspiration EXAM: PORTABLE CHEST 1 VIEW COMPARISON:  03/25/2020 FINDINGS: Mild left basilar atelectasis or infiltrate. Lungs are otherwise clear. No pneumothorax or pleural effusion. Surgical clips are again noted within the right paratracheal region. Cardiac size within normal limits. Pulmonary vascularity is normal. No acute bone abnormality. IMPRESSION: 1. Mild left basilar atelectasis or infiltrate. Electronically Signed   By: Fidela Salisbury M.D.   On: 01/10/2022 22:19    Pending Labs Unresulted Labs (From admission, onward)     Start     Ordered   01/18/22 0500  Creatinine, serum  (enoxaparin (LOVENOX)    CrCl >/= 30 ml/min)  Weekly,   R     Comments: while on enoxaparin therapy    01/11/22 0028   01/10/22 2357  10-Hydroxycarbazepine  Add-on,   AD        01/10/22 2356   01/10/22 2253  Topiramate level  Add-on,   AD        01/10/22 2253    01/10/22 2142  Levetiracetam level  Once,   URGENT        01/10/22 2141            Vitals/Pain Today's Vitals   01/11/22 1227 01/11/22 1315 01/11/22 1356 01/11/22 1524  BP:  130/80  122/82  Pulse:  71  77  Resp:  13  14  Temp:    97.8 F (36.6 C)  TempSrc:    Oral  SpO2:  97%  99%  PainSc: Asleep  Asleep     Isolation Precautions No active isolations  Medications Medications  levETIRAcetam (KEPPRA) IVPB 1500 mg/ 100 mL premix (0 mg Intravenous Stopped 01/11/22 1021)  Oxcarbazepine (TRILEPTAL) tablet 1,200 mg (1,200 mg Per NG tube Given 01/11/22 0920)  topiramate (TOPAMAX) tablet 25 mg (25 mg Per NG tube Given 01/11/22 0920)  Oral care mouth rinse (15 mLs Mouth Rinse Not Given 01/11/22 1521)  Oral care mouth rinse (has no administration in time range)  0.9 %  sodium chloride infusion (75 mL/hr Intravenous Rate/Dose Verify 01/11/22 0754)  enoxaparin (LOVENOX) injection 40 mg (40 mg Subcutaneous Given 01/11/22 0755)  levETIRAcetam (KEPPRA) IVPB 1000 mg/100 mL premix (0 mg Intravenous Stopped 01/10/22 2202)  LORazepam (ATIVAN) injection 2 mg (2 mg Intravenous Given 01/10/22 2148)  lacosamide (VIMPAT) 200 mg  in sodium chloride 0.9 % 25 mL IVPB (0 mg Intravenous Stopped 01/10/22 2333)  LORazepam (ATIVAN) injection 2 mg (2 mg Intravenous Given 01/10/22 2153)  levETIRAcetam (KEPPRA) IVPB 1000 mg/100 mL premix (0 mg Intravenous Stopped 01/10/22 2218)    Mobility walks Low fall risk   Focused Assessments    R Recommendations: See Admitting Provider Note  Report given to:   Additional Notes: Condom catheter

## 2022-01-11 NOTE — ED Notes (Signed)
Spoke with Dr. Glo Herring reference NG tube connected to suction and was advised that I didn't need to

## 2022-01-11 NOTE — Subjective & Objective (Addendum)
Mr. Brian Ward, a 46 y/o with a hx of seizure starting in 2015. First seizure in [place] and he evidently did requie intubtion. He has been seen by neurology intermittently but he gets lost to follow up with his moving from location to location, e.g. to [place], to [place], to [place] and back to [place].  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   He has had multiple ED visits for seizure and medication issues. He was seen by Dr. Delice Lesch, [facility] 11/29/21. HIs medications were reviewed and renewed: Currently on Vimpat, Keppra and topamax.   On the day of admission he had 3 break-thru seizures, a grand mal seizure witnessed lasting 5 minutes followed by additional seizure and presents to [facility] for evaluation and treatment.

## 2022-01-11 NOTE — Assessment & Plan Note (Signed)
Patient with h/o seizures, medication adherence issues and multiple ED visits. He now presents with seizures x 3. Depakote level was undetectable. He was seen in consultation by DR. Khaliqdina, neurology, who recommended EEG, NG tube to insure delivery of medications  and adjusted medications doses.  Plan Inpatient care while getting new doses and while completing EEG  Admit to progressive care due to need for close observation - a risk for threatened airway 2/2 obtundation.  Neurology will follow.

## 2022-01-11 NOTE — ED Notes (Signed)
Noted drainage in NG tube. Admit provider paged. Spoke with Dr. Velia Meyer and advised to connect the NG tube to low wall intermittent suction at this time

## 2022-01-11 NOTE — Progress Notes (Signed)
vLTM discontinued. No skin breakdown noted at all skin sites.  ED patient.  [facility] not monitoring

## 2022-01-11 NOTE — Progress Notes (Addendum)
LTM EEG hooked up and running - no initial skin breakdown - push button tested   

## 2022-01-11 NOTE — Evaluation (Signed)
Clinical/Bedside Swallow Evaluation Patient Details  Name: Brian Ward MRN: [id number] Date of Birth: [birth year]  Today's Date: 01/11/2022 Time: SLP Start Time (ACUTE ONLY): 1000 SLP Stop Time (ACUTE ONLY): 1020 SLP Time Calculation (min) (ACUTE ONLY): 20 min  Past Medical History:  Past Medical History:  Diagnosis Date   Seizures ([place])    Past Surgical History:  Past Surgical History:  Procedure Laterality Date   CARDIAC SURGERY     46 yrs old   HPI:  Patient is a 46 y.o. 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 He has been seen by neurology intermittently but gets lost to f/u due to patient moving frequently (to [place], to [place], to [place], back to [place]). Patient presented to the hospital on 01/11/22 after having witnessed seizure (grand mal) lasting 5 minutes and followed by additional seizure. In ED he was awake and conversant, afebrile. He had a witnessed seizure in ED nd was given 1g IV keppra x 2, IV ativan '2mg'$  x 2 and has been obtunded since. NG placed for delivery of medication and recommendation for TRH admit to progressive care.    Assessment / Plan / Recommendation  Clinical Impression  Patient presenting with mild oral dysphagia which appears secondary to his current state of lethargy/somnolence. His pharyngeal phase of swallow appears [id number]-[id number]. SLP observed patient with PO intake of water, soda, applesauce. Swallow initation was timely and patient consumed thin liquids via straw and cup sips. No coughing or throat clearing observed. He was able to feed himself puree solids (applesauce) but did require some cues and assistance from SLP and his significant other in the room due to decreased attention and awareness. SLP is recommending regular solids and thin liquids with supervision and assistance to ensure he is  awake, alert and responding well enough for PO intake. Do not anticipate any further need for SLP and will s/o at this time but please reorder if patient exhibits any further concerns for dysphagia. Thank you for this consult! SLP Visit Diagnosis: Dysphagia, oropharyngeal phase (R13.12)    Aspiration Risk  No limitations;Mild aspiration risk    Diet Recommendation Regular;Thin liquid   Liquid Administration via: Cup;Straw Medication Administration: Whole meds with liquid Supervision: Patient able to self feed;Full supervision/cueing for compensatory strategies;Staff to assist with self feeding Compensations: Slow rate;Small sips/bites Postural Changes: Seated upright at 90 degrees    Other  Recommendations Oral Care Recommendations: Oral care BID    Recommendations for follow up therapy are one component of a multi-disciplinary discharge planning process, led by the attending physician.  Recommendations may be updated based on patient status, additional functional criteria and insurance authorization.  Follow up Recommendations No SLP follow up      Assistance Recommended at Discharge    Functional Status Assessment Patient has had a recent decline in their functional status and demonstrates the ability to make significant improvements in function in a reasonable and predictable amount of time.  Frequency and Duration   N/A         Prognosis   N/A     Swallow Study   General Date of Onset: 01/11/22 HPI: Patient is a 46 y.o. 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 He has been seen by neurology intermittently but  gets lost to f/u due to patient moving frequently (to [place], to [place], to [place], back to [place]). Patient presented to the hospital on 01/11/22 after having witnessed seizure (grand mal) lasting 5  minutes and followed by additional seizure. In ED he was awake and conversant, afebrile. He had a witnessed seizure in ED nd was given 1g IV keppra x 2, IV ativan '2mg'$  x 2 and has been obtunded since. NG placed for delivery of medication and recommendation for TRH admit to progressive care. Type of Study: Bedside Swallow Evaluation Previous Swallow Assessment: none found Diet Prior to this Study: NPO Temperature Spikes Noted: No Respiratory Status: Nasal cannula History of Recent Intubation: No Behavior/Cognition: Alert;Cooperative;Lethargic/Drowsy;Confused Oral Cavity Assessment: Within Functional Limits Oral Care Completed by SLP: Yes Self-Feeding Abilities: Needs assist;Needs set up Patient Positioning: Upright in bed Baseline Vocal Quality: Low vocal intensity;Normal Volitional Cough: Cognitively unable to elicit Volitional Swallow: Able to elicit    Oral/Motor/Sensory Function Overall Oral Motor/Sensory Function: Within functional limits   Ice Chips     Thin Liquid Thin Liquid: Impaired Presentation: Straw Oral Phase Impairments: Poor awareness of bolus    Nectar Thick     Honey Thick     Puree Puree: Impaired Oral Phase Impairments: Poor awareness of bolus   Solid     Solid: Not tested     Sonia Baller, MA, CCC-SLP Speech Therapy

## 2022-01-12 ENCOUNTER — Other Ambulatory Visit: Payer: Self-pay

## 2022-01-12 DIAGNOSIS — R569 Unspecified convulsions: Secondary | ICD-10-CM | POA: Diagnosis not present

## 2022-01-12 LAB — TOPIRAMATE LEVEL: Topiramate Lvl: <1.5 ug/mL — ABNORMAL LOW (ref 2.0–25.0)

## 2022-01-12 LAB — LEVETIRACETAM LEVEL: Levetiracetam Lvl: <2 ug/mL — ABNORMAL LOW (ref 10.0–40.0)

## 2022-01-12 MED ORDER — LEVETIRACETAM 750 MG PO TABS
1500.0000 mg | ORAL_TABLET | Freq: Two times a day (BID) | ORAL | Status: DC
  Administered 2022-01-12: 1500 mg via ORAL
  Filled 2022-01-12: qty 2

## 2022-01-12 MED ORDER — LEVETIRACETAM 500 MG PO TABS
1500.0000 mg | ORAL_TABLET | Freq: Two times a day (BID) | ORAL | 2 refills | Status: DC
  Filled 2022-01-12: qty 180, 30d supply, fill #0
  Filled 2022-02-24: qty 180, 30d supply, fill #1
  Filled 2022-03-31: qty 180, 30d supply, fill #2

## 2022-01-12 NOTE — Progress Notes (Signed)
Neurology Progress Note  Brief HPI: 46 y.o. male with PMH significant for seizures on Keppra, Trileptal and Topamax who had a 5 mins GTC at home and then an additional seizure at home. He was following commands for EMS and was brought in to the ED. In the ED, he was following commands and answering questions and then had another 3 mins GTC seizure in the ED. He got Ativan '4mg'$  and Keppra load 2G in the ED   Subjective: Patient seen in room and states that he is back to his baseline. He would like to be discharged.   Exam: Vitals:   01/12/22 0354 01/12/22 0752  BP: 120/64 (!) 145/84  Pulse: 73 69  Resp: 17 20  Temp: 99.1 F (37.3 C) 97.6 F (36.4 C)  SpO2: 99% 98%   Gen: In bed, NAD Resp: non-labored breathing, no acute distress Abd: soft, nt  Neuro: Mental Status: AaOx4. Answers all questions appropriately. Cognition intact Cranial Nerves: II: Visual Fields are full. PERRL.   III,IV, VI: EOM intact V: Facial sensation is symmetric to temperature VII: Facial movement is symmetric resting and smiling VIII: Hearing is intact to voice X: Palate elevates symmetrically XI: Shoulder shrug is symmetric. XII: Tongue protrudes midline without atrophy or fasciculations.  Motor: Tone is normal. Bulk is normal. 5/5 strength was present in all four extremities.  Sensory: Sensation is symmetric to light touch in the arms and legs.  Cerebellar: FNF intact bilaterally Gait: steady     Pertinent Labs: UDS positive for Benzos and THC (received ativan prior to UDS in ED)  Imaging Reviewed: cEEG 0116 01/11/2022 - 0730 01/11/2022- This was a continuous video EEG within limits of normal. Sleep comprised the majority of the recording. No seizures were seen    Assessment: Brian Ward is a 46 y.o. male with PMH significant for seizures on Keppra, Trileptal and Topamax who presents with seizure clustering with 3 breakthrough seizures. Per fiancee, compliant with meds the best she can tell. No  recent illness.   Received  - Keppra load of 2G BID - Vimpat '200mg'$  IV once - Home Topiramate and Trileptal. - Keppra to 1500 BID   Recommendations: - Continue Keppra '1500mg'$  BID - Continue home Topiramate and Trileptal    Discussed [facility] statutes, patients with seizures are not allowed to drive until they have been seizure-free for six months Use caution when using heavy equipment or power tools. Avoid working on ladders or at heights. Take showers instead of baths. Ensure the water temperature is not too high on the home water heater. Do not go swimming alone. Do not lock yourself in a room alone (i.e. bathroom). When caring for infants or small children, sit down when holding, feeding, or changing them to minimize risk of injury to the child in the event you have a seizure. Maintain good sleep hygiene. Avoid alcohol.    Patient seen and examined by NP/APP with MD. MD to update note as needed.   Janine Ores, DNP, FNP-BC [facility] Pager: [phone]

## 2022-01-12 NOTE — TOC Transition Note (Addendum)
Transition of Care [facility]) - CM/SW Discharge Note   Patient Details  Name: Brian Ward MRN: [id number] Date of Birth: 09-Dec-1975  [facility] CM/SW Contact:  Pollie Friar, RN Phone Number: 01/12/2022, 11:16 AM   Clinical Narrative:    Pt is discharging home with self care. Pt lives with SO and her children.  No DME. Pt was driving self but states his SO can provide transportation. Pt does admit to missing a dose of his seizure medication. He verbalizes a plan to not do this again.  Pt without a PCP. Pt asked for assistance in finding a PCP. Appt scheduled with [facility] and information on the AVS. Pt has transportation home.   1147: pt unable to get a ride home and doesn't have the funds. CM has provided the bedside RN with a cab voucher.    Final next level of care: Home/Self Care Barriers to Discharge: No Barriers Identified   Patient Goals and CMS Choice        Discharge Placement                       Discharge Plan and Services                                     Social Determinants of Health (SDOH) Interventions     Readmission Risk Interventions     No data to display

## 2022-01-12 NOTE — Discharge Summary (Signed)
Physician Discharge Summary  Brian Ward UTM:[id number] DOB: [birth year] DOA: 01/10/2022  PCP: Pcp, No  Admit date: 01/10/2022 Discharge date: 01/12/2022  Admitted From: Home Disposition: Home  Recommendations for Outpatient Follow-up:  Follow up with PCP in 1-2 weeks Follow-up with neurology, Dr. Delice Lesch in 2-3 weeks Keppra increased to 1500 mg p.o. twice daily  Home Health: No Equipment/Devices: None  Discharge Condition: Stable CODE STATUS: Full code Diet recommendation: Diet  History of present illness:  Brian Ward is a 46 y.o. male with past medical history significant for seizure disorder who presented to [facility] ED on 12/5 via EMS for seizure activity.  Family reports seizure lasting approximately 5 minutes in which he briefly recovered and subsequently had a second seizure that lasted a few minutes.  Patient was incontinent during event.  Currently on Vimpat, Keppra and Topamax outpatient.  He is followed by neurology, Dr. Delice Lesch.  Patient reports that he is compliant with his therapy although previous notes note noncompliance; as he has had multiple ED visits for seizure and medication issues.  He is seen by neurology intermittently but gets lost to follow-up given his moving from location to location.  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   In the ED, afebrile, BP 106/68, HR was 78, RR 13.  Per EDP note, patient was alert, awake and conversant on presentation but had another witnessed seizure in the ED and was given 1 g IV Keppra x 2, IV Ativan 2 mg Maggie Font to and has been somnolent since.  Sodium 141, potassium 3.5, chloride 107, CO2 20, glucose 118, BUN 9, creatinine 1.20.  AST 25, ALT 14, total bilirubin 0.4.  WBC 6.0, hemoglobin 13.3, platelets 242.  Valproic  acid less than 10.  COVID-19 PCR negative for influenza A/B PCR negative.  EtOH level less than 10.  UDS positive for THC and benzos.  CT head without contrast with no acute intracranial hemorrhage or infarct, empty expanded sella and dilation of the optic nerves, extensive opacification of left mastoid cells and middle ear cavity.  Chest x-Nies with mild left basilar atelectasis versus infiltrate. Neurology was consulted and recommended medication changes, NG tube placement for delivery of medication and admission to the hospitalist service for close monitoring.  He also ordered continuous EEG monitoring.  Hospital course:  Epilepsy with breakthrough seizures Patient presenting to ED with clustering of 3 breakthrough seizures at home.  Per family he is compliant with medications but given previous hospitalizations/ED visits with recurrence of seizures and poor follow-up, suspect noncompliance.  Valproic acid level less than 10.  UDS positive for THC.  Patient was given loading dose of IV Keppra, IV Ativan.  Patient was placed on continuous EEG that was within normal limits.  Neurology was consulted and following hospital course.  Patient's Keppra was increased to 1500 mg p.o. twice daily.  Continued on Trileptal 1200 mg p.o. twice daily and Topamax 25 mg p.o. twice daily.  Outpatient follow-up with neurology, Dr. Delice Lesch in 2-3 weeks.  Discussed [facility] statutes, patients with seizures are not allowed to drive until they have been seizure-free for six months Use caution when using heavy equipment or power tools. Avoid working on ladders or at heights. Take showers instead of baths. Ensure the water temperature is not too high on the home water heater. Do not go  swimming alone. Do not lock yourself in a room alone (i.e. bathroom). When caring for infants or small children, sit down when holding, feeding, or changing them to minimize risk of injury to the child in the event you have a seizure.  Maintain good sleep hygiene. Avoid alcohol.   Discharge Diagnoses:  Principal Problem:   Seizures [facility])    Discharge Instructions  Discharge Instructions     Call MD for:  difficulty breathing, headache or visual disturbances   Complete by: As directed    Call MD for:  extreme fatigue   Complete by: As directed    Call MD for:  persistant dizziness or light-headedness   Complete by: As directed    Call MD for:  persistant nausea and vomiting   Complete by: As directed    Call MD for:  redness, tenderness, or signs of infection (pain, swelling, redness, odor or green/yellow discharge around incision site)   Complete by: As directed    Call MD for:  severe uncontrolled pain   Complete by: As directed    Call MD for:  temperature >100.4   Complete by: As directed    Diet - low sodium heart healthy   Complete by: As directed    Increase activity slowly   Complete by: As directed       Allergies as of 01/12/2022       Reactions   Depakote [divalproex Sodium] Other (See Comments)   The patient said it made him feel uneasy and not like himself   Pork-derived Products Other (See Comments)    Prefers to not eat pork- "not good for me"        Medication List     STOP taking these medications    acetaZOLAMIDE 250 MG tablet Commonly known as: DIAMOX   amoxicillin 500 MG capsule Commonly known as: AMOXIL       TAKE these medications    levETIRAcetam 500 MG tablet Commonly known as: Keppra Take 3 tablets (1,500 mg total) by mouth 2 (two) times daily. What changed:  medication strength how much to take   oxcarbazepine 600 MG tablet Commonly known as: TRILEPTAL Take 2 tablets (1,200 mg total) by mouth 2 (two) times daily.   topiramate 25 MG tablet Commonly known as: TOPAMAX Take 1 tablet (25 mg total) by mouth 2 (two) times daily.        Follow-up Information     Cameron Sprang, MD. Schedule an appointment as soon as possible for a visit in 3 week(s).    Specialty: Neurology Contact information: [address] [place] [address] [phone]                Allergies  Allergen Reactions   Depakote [Divalproex Sodium] Other (See Comments)    The patient said it made him feel uneasy and not like himself   Pork-Derived Products Other (See Comments)     Prefers to not eat pork- "not good for me"    Consultations: Neurology   Procedures/Studies: Overnight EEG with video  Result Date: 01/11/2022 Samuella Cota, MD     01/11/2022  8:42 AM EEG Procedure CPT/Type of Study: 68372; 2-12hr EEG with video Referring Provider: [place] Primary Neurological Diagnosis: seizures History: This is a 46 yr old patient, undergoing an EEG to evaluate for seizures, status epilepticus. Clinical State: disoriented Technical Description: The EEG was performed using standard setting per the guidelines of American Clinical Neurophysiology Society (ACNS). A minimum of  21 electrodes were placed on scalp according to the International 10-20 or/and 10-10 Systems. Supplemental electrodes were placed as needed. Single EKG electrode was also used to detect cardiac arrhythmia. Patient's behavior was continuously recorded on video simultaneously with EEG. A minimum of 16 channels were used for data display. Each epoch of study was reviewed manually daily and as needed using standard referential and bipolar montages. Computerized quantitative EEG analysis (such as compressed spectral array analysis, trending, automated spike & seizure detection) were used as indicated. Day 1: from 0116 01/11/22 to 0730 01/11/22 EEG Description: Overall Amplitude: Low Predominant Frequency: The background activity showed posterior dominant alpha, with about 9 Hz, that was rare. Superimposed Frequencies: occasional theta and some beta activity bilaterally The background was symmetric Background Abnormalities: None Rhythmic or periodic pattern: No Epileptiform activity: no  Electrographic seizures: no Events: no Breach rhythm: no Reactivity: Present Stimulation procedures: Hyperventilation: not done Photic stimulation: no change Sleep Background: Stage II EKG:no significant arrhythmia Impression: This was a continuous video EEG within limits of normal. Sleep comprised the majority of the recording. No seizures were seen.   DG Abdomen 1 View  Result Date: 01/11/2022 CLINICAL DATA:  Repositioning of NG tube. EXAM: ABDOMEN - 1 VIEW COMPARISON:  01/11/2022. FINDINGS: The bowel gas pattern is normal. The enteric tube terminates in the stomach and appears appropriate in position. Mild airspace disease is present at the left lung base. No radio-opaque calculi or other acute radiographic abnormality are seen. IMPRESSION: 1. Enteric tube terminates in the stomach. 2. Stable airspace disease at the left lung base, possible atelectasis or infiltrate. Electronically Signed   By: Brett Fairy M.D.   On: 01/11/2022 01:38   DG Abdomen 1 View  Result Date: 01/11/2022 CLINICAL DATA:  NGT placement. EXAM: ABDOMEN - 1 VIEW COMPARISON:  None Available. FINDINGS: The stomach is distended with ingested debris. The side port of an enteric tube terminates at the gastroesophageal junction and should be advanced approximately 9 cm. The small bowel is not included in the field of view. Mild atelectasis is present at the left lung base and in the mid right lung. No acute osseous abnormality. Surgical clips are present in the right paratracheal space. IMPRESSION: 1. Side port of the enteric tube terminates at the gastroesophageal junction and should be advanced approximately 9 cm. 2. Mild atelectasis at the left lung base and mid right lung. Electronically Signed   By: Brett Fairy M.D.   On: 01/11/2022 00:36   CT Head Wo Contrast  Result Date: 01/10/2022 CLINICAL DATA:  Seizure disorder, clinical change EXAM: CT HEAD WITHOUT CONTRAST TECHNIQUE: Contiguous axial images were obtained from the base of  the skull through the vertex without intravenous contrast. RADIATION DOSE REDUCTION: This exam was performed according to the departmental dose-optimization program which includes automated exposure control, adjustment of the mA and/or kV according to patient size and/or use of iterative reconstruction technique. COMPARISON:  06/08/2021 FINDINGS: Brain: Empty, expanded sella again noted, better assessed on MRI examination of 07/01/2020. Otherwise normal anatomic configuration of the brain. No abnormal intra or extra-axial mass lesion. No abnormal mass effect or midline shift. No acute intracranial hemorrhage or infarct. Ventricular size is normal. Cerebellum is unremarkable. Vascular: No hyperdense vessel or unexpected calcification. Skull: The there is deficiency involving the dorsal cortex of the mastoid air cells better seen on coronal image # 46/5, better assessed on prior CT examination of 03/24/2020. No acute fracture. Sinuses/Orbits: The optic nerves appear thickened bilaterally likely representing dilation of  the optic nerve sheaths better assessed on MRI examination of 07/01/2020. The orbits are otherwise unremarkable. Paranasal sinuses are clear. Other: There is extensive opacification of the left mastoid air cells and middle ear cavity, similar to multiple prior examinations. Right mastoid air cells and middle ear cavities are clear. IMPRESSION: 1. No acute intracranial hemorrhage or infarct 2. 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 Additional findings outlined on that examination suggest changes of intracranial hypertension. 3. Extensive opacification of the left mastoid air cells and middle ear cavity, possibly related to osseous deficiency involving the tegmen tympani and dorsal cortex of the mastoid air cells. This appears similar to multiple prior examinations. Electronically Signed   By: Fidela Salisbury M.D.   On: 01/10/2022 23:04    DG Chest Portable 1 View  Result Date: 01/10/2022 CLINICAL DATA:  Aspiration EXAM: PORTABLE CHEST 1 VIEW COMPARISON:  03/25/2020 FINDINGS: Mild left basilar atelectasis or infiltrate. Lungs are otherwise clear. No pneumothorax or pleural effusion. Surgical clips are again noted within the right paratracheal region. Cardiac size within normal limits. Pulmonary vascularity is normal. No acute bone abnormality. IMPRESSION: 1. Mild left basilar atelectasis or infiltrate. Electronically Signed   By: Fidela Salisbury M.D.   On: 01/10/2022 22:19     Subjective: Patient seen examined bedside, resting calmly.  Searching the Internet on his iPhone.  No complaints this morning.  Feels ready to discharge home today.  Discussed with patient increased dose of Keppra and then needs appointment with his neurologist soon, he reports has a appointment scheduled in April with Dr. Delice Lesch; but will need a sooner appointment given his recurrent seizures.  No other questions or concerns at this time.  Denies headache, no dizziness, no chest pain, no palpitations, no shortness of breath, no abdominal pain, no fever/chills/night sweats, no nausea/vomiting/diarrhea, no focal weakness, no fatigue, no paresthesias.  No acute events overnight per nursing staff.  Discharge Exam: Vitals:   01/12/22 0354 01/12/22 0752  BP: 120/64 (!) 145/84  Pulse: 73 69  Resp: 17 20  Temp: 99.1 F (37.3 C) 97.6 F (36.4 C)  SpO2: 99% 98%   Vitals:   01/11/22 1809 01/11/22 2321 01/12/22 0354 01/12/22 0752  BP: (!) 127/97 130/80 120/64 (!) 145/84  Pulse: 69 64 73 69  Resp: '14 16 17 20  '$ Temp: 98.3 F (36.8 C) 98.9 F (37.2 C) 99.1 F (37.3 C) 97.6 F (36.4 C)  TempSrc: Oral Oral Oral Oral  SpO2: 98% 100% 99% 98%    Physical Exam: GEN: NAD, alert and oriented x 3, appears older than stated age HEENT: NCAT, PERRL, EOMI, sclera clear, MMM PULM: CTAB w/o wheezes/crackles, normal respiratory effort, on room air CV: RRR w/o  M/G/R GI: abd soft, NTND, NABS, no R/G/M MSK: no peripheral edema, muscle strength globally intact 5/5 bilateral upper/lower extremities NEURO: CN II-XII intact, no focal deficits, sensation to light touch intact PSYCH: normal mood/affect Integumentary: dry/intact, no rashes or wounds    The results of significant diagnostics from this hospitalization (including imaging, microbiology, ancillary and laboratory) are listed below for reference.     Microbiology: Recent Results (from the past 240 hour(s))  Resp Panel by RT-PCR (Flu A&B, Covid) Anterior Nasal Swab     Status: None   Collection Time: 01/10/22  9:42 PM   Specimen: Anterior Nasal Swab  Result Value Ref Range Status   SARS Coronavirus 2 by RT PCR NEGATIVE NEGATIVE Final    Comment: (  NOTE) SARS-CoV-2 target nucleic acids are NOT DETECTED.  The SARS-CoV-2 RNA is generally detectable in upper respiratory specimens during the acute phase of infection. The lowest concentration of SARS-CoV-2 viral copies this assay can detect is 138 copies/mL. A negative result does not preclude SARS-Cov-2 infection and should not be used as the sole basis for treatment or other patient management decisions. A negative result may occur with  improper specimen collection/handling, submission of specimen other than nasopharyngeal swab, presence of viral mutation(s) within the areas targeted by this assay, and inadequate number of viral copies(<138 copies/mL). A negative result must be combined with clinical observations, patient history, and epidemiological information. The expected result is Negative.  Fact Sheet for Patients:  EntrepreneurPulse.com.au  Fact Sheet for Healthcare Providers:  IncredibleEmployment.be  This test is no t yet approved or cleared by the [place] FDA and  has been authorized for detection and/or diagnosis of SARS-CoV-2 by FDA under an Emergency Use Authorization (EUA). This  EUA will remain  in effect (meaning this test can be used) for the duration of the COVID-19 declaration under Section 564(b)(1) of the Act, 21 U.S.C.section 360bbb-3(b)(1), unless the authorization is terminated  or revoked sooner.       Influenza A by PCR NEGATIVE NEGATIVE Final   Influenza B by PCR NEGATIVE NEGATIVE Final    Comment: (NOTE) The Xpert Xpress SARS-CoV-2/FLU/RSV plus assay is intended as an aid in the diagnosis of influenza from Nasopharyngeal swab specimens and should not be used as a sole basis for treatment. Nasal washings and aspirates are unacceptable for Xpert Xpress SARS-CoV-2/FLU/RSV testing.  Fact Sheet for Patients: EntrepreneurPulse.com.au  Fact Sheet for Healthcare Providers: IncredibleEmployment.be  This test is not yet approved or cleared by the [place] FDA and has been authorized for detection and/or diagnosis of SARS-CoV-2 by FDA under an Emergency Use Authorization (EUA). This EUA will remain in effect (meaning this test can be used) for the duration of the COVID-19 declaration under Section 564(b)(1) of the Act, 21 U.S.C. section 360bbb-3(b)(1), unless the authorization is terminated or revoked.  Performed at [facility], [place] [address], [place], [place] [address]      Labs: BNP (last 3 results) No results for input(s): "BNP" in the last 8760 hours. Basic Metabolic Panel: Recent Labs  Lab 01/10/22 2141 01/11/22 0024  NA 141  --   K 3.5  --   CL 107  --   CO2 20*  --   GLUCOSE 118*  --   BUN 9  --   CREATININE 1.20  --   CALCIUM 9.2  --   MG  --  2.1   Liver Function Tests: Recent Labs  Lab 01/10/22 2141  AST 25  ALT 14  ALKPHOS 98  BILITOT 0.4  PROT 7.3  ALBUMIN 4.3   No results for input(s): "LIPASE", "AMYLASE" in the last 168 hours. No results for input(s): "AMMONIA" in the last 168 hours. CBC: Recent Labs  Lab 01/10/22 2141  WBC 6.0  NEUTROABS 2.8  HGB 13.3   HCT 41.1  MCV 94.5  PLT 242   Cardiac Enzymes: No results for input(s): "CKTOTAL", "CKMB", "CKMBINDEX", "TROPONINI" in the last 168 hours. BNP: Invalid input(s): "POCBNP" CBG: No results for input(s): "GLUCAP" in the last 168 hours. D-Dimer No results for input(s): "DDIMER" in the last 72 hours. Hgb A1c No results for input(s): "HGBA1C" in the last 72 hours. Lipid Profile No results for input(s): "CHOL", "HDL", "LDLCALC", "TRIG", "CHOLHDL", "LDLDIRECT" in  the last 72 hours. Thyroid function studies No results for input(s): "TSH", "T4TOTAL", "T3FREE", "THYROIDAB" in the last 72 hours.  Invalid input(s): "FREET3" Anemia work up No results for input(s): "VITAMINB12", "FOLATE", "FERRITIN", "TIBC", "IRON", "RETICCTPCT" in the last 72 hours. Urinalysis    Component Value Date/Time   COLORURINE YELLOW 03/24/2020 1826   APPEARANCEUR CLEAR 03/24/2020 1826   LABSPEC 1.039 (H) 03/24/2020 1826   PHURINE 5.0 03/24/2020 1826   GLUCOSEU NEGATIVE 03/24/2020 1826   HGBUR NEGATIVE 03/24/2020 1826   BILIRUBINUR NEGATIVE 03/24/2020 1826   KETONESUR NEGATIVE 03/24/2020 1826   PROTEINUR 100 (A) 03/24/2020 1826   UROBILINOGEN 0.2 10/03/2015 1419   NITRITE NEGATIVE 03/24/2020 1826   LEUKOCYTESUR NEGATIVE 03/24/2020 1826   Sepsis Labs Recent Labs  Lab 01/10/22 2141  WBC 6.0   Microbiology Recent Results (from the past 240 hour(s))  Resp Panel by RT-PCR (Flu A&B, Covid) Anterior Nasal Swab     Status: None   Collection Time: 01/10/22  9:42 PM   Specimen: Anterior Nasal Swab  Result Value Ref Range Status   SARS Coronavirus 2 by RT PCR NEGATIVE NEGATIVE Final    Comment: (NOTE) SARS-CoV-2 target nucleic acids are NOT DETECTED.  The SARS-CoV-2 RNA is generally detectable in upper respiratory specimens during the acute phase of infection. The lowest concentration of SARS-CoV-2 viral copies this assay can detect is 138 copies/mL. A negative result does not preclude SARS-Cov-2 infection  and should not be used as the sole basis for treatment or other patient management decisions. A negative result may occur with  improper specimen collection/handling, submission of specimen other than nasopharyngeal swab, presence of viral mutation(s) within the areas targeted by this assay, and inadequate number of viral copies(<138 copies/mL). A negative result must be combined with clinical observations, patient history, and epidemiological information. The expected result is Negative.  Fact Sheet for Patients:  EntrepreneurPulse.com.au  Fact Sheet for Healthcare Providers:  IncredibleEmployment.be  This test is no t yet approved or cleared by the [place] FDA and  has been authorized for detection and/or diagnosis of SARS-CoV-2 by FDA under an Emergency Use Authorization (EUA). This EUA will remain  in effect (meaning this test can be used) for the duration of the COVID-19 declaration under Section 564(b)(1) of the Act, 21 U.S.C.section 360bbb-3(b)(1), unless the authorization is terminated  or revoked sooner.       Influenza A by PCR NEGATIVE NEGATIVE Final   Influenza B by PCR NEGATIVE NEGATIVE Final    Comment: (NOTE) The Xpert Xpress SARS-CoV-2/FLU/RSV plus assay is intended as an aid in the diagnosis of influenza from Nasopharyngeal swab specimens and should not be used as a sole basis for treatment. Nasal washings and aspirates are unacceptable for Xpert Xpress SARS-CoV-2/FLU/RSV testing.  Fact Sheet for Patients: EntrepreneurPulse.com.au  Fact Sheet for Healthcare Providers: IncredibleEmployment.be  This test is not yet approved or cleared by the [place] FDA and has been authorized for detection and/or diagnosis of SARS-CoV-2 by FDA under an Emergency Use Authorization (EUA). This EUA will remain in effect (meaning this test can be used) for the duration of the COVID-19 declaration  under Section 564(b)(1) of the Act, 21 U.S.C. section 360bbb-3(b)(1), unless the authorization is terminated or revoked.  Performed at [facility], [place] [address], [place], [place] [address]      Time coordinating discharge: Over 30 minutes  SIGNED:   Addisynn Vassell J [place] DO  [facility] 01/12/2022, 9:54 AM

## 2022-01-12 NOTE — Discharge Instructions (Signed)
Discussed [place] DMV statutes, patients with seizures are not allowed to drive until they have been seizure-free for six months Use caution when using heavy equipment or power tools. Avoid working on ladders or at heights. Take showers instead of baths. Ensure the water temperature is not too high on the home water heater. Do not go swimming alone. Do not lock yourself in a room alone (i.e. bathroom). When caring for infants or small children, sit down when holding, feeding, or changing them to minimize risk of injury to the child in the event you have a seizure. Maintain good sleep hygiene. Avoid alcohol.   

## 2022-01-13 LAB — 10-HYDROXYCARBAZEPINE: Triliptal/MTB(Oxcarbazepin): 2 ug/mL — ABNORMAL LOW (ref 10–35)

## 2022-01-16 ENCOUNTER — Other Ambulatory Visit: Payer: Self-pay

## 2022-01-17 NOTE — Progress Notes (Deleted)
   Subjective:  Patient ID: Brian Ward, male    DOB: 08-06-1975, 46 y.o.   MRN: [id number]  CC: New Patient  HPI:  Brian Ward is a very pleasant 46 y.o. male who presents today to establish care.  Hx seizures, recently admitted for this 01/10/22. On Keppra, topamax, trileptal*** f/u with neuro on ***   PMHx: Past Medical History:  Diagnosis Date   Seizures ([place])     Surgical Hx: Past Surgical History:  Procedure Laterality Date   CARDIAC SURGERY     46 yrs old    Family Hx: Family History  Problem Relation Age of Onset   Seizures Maternal Aunt    Sickle cell anemia Maternal Aunt    Diabetes Mother     Social Hx: Current Social History   Who lives at home: ***  Who would speak for you about health care matters: ***  Transportation: ***  Important Relationships & Pets: ***   Current Stressors: ***  Work / Education:  ***  Religious / Personal Beliefs: ***  Interests / Fun: ***  Other: ***    Medications:   ROS: Woman:  Patient reports no  vision/ hearing changes,anorexia, weight change, fever ,adenopathy, persistant / recurrent hoarseness, swallowing issues, chest pain, edema,persistant / recurrent cough, hemoptysis, dyspnea(rest, exertional, paroxysmal nocturnal), gastrointestinal  bleeding (melena, rectal bleeding), abdominal pain, excessive heart burn, GU symptoms(dysuria, hematuria, pyuria, voiding/incontinence  Issues) syncope, focal weakness, severe memory loss, concerning skin lesions, depression, anxiety, abnormal bruising/bleeding, major joint swelling, breast masses or abnormal vaginal bleeding.    Man:  Patient reports no  vision/ hearing changes,anorexia, weight change, fever ,adenopathy, persistant / recurrent hoarseness, swallowing issues, chest pain, edema,persistant / recurrent cough, hemoptysis, dyspnea(rest, exertional, paroxysmal nocturnal), gastrointestinal  bleeding (melena, rectal bleeding), abdominal pain, excessive heart burn, GU  symptoms(dysuria, hematuria, pyuria, voiding/incontinence  Issues) syncope, focal weakness, severe memory loss, concerning skin lesions, depression, anxiety, abnormal bruising/bleeding, major joint swelling.    Preventative Screening Colonoscopy: year*** results *** Mammogram: year*** results *** Pap test: year*** results *** PSA: year*** results *** DEXA: year*** results *** Tetanus vaccine: year*** results *** Pneumonia vaccine: year*** results *** Shingles vaccine: year*** results *** Heart stress test: year*** results *** Echocardiogram: year*** results *** Xrays: year*** results *** CT/MRI: year*** results ***  Smoking status reviewed  ROS: pertinent noted in the HPI    Objective:  There were no vitals taken for this visit. Vitals and nursing note reviewed  General: NAD, pleasant, able to participate in exam HEENT: normocephalic, TM's visualized bilaterally, no scleral icterus or conjunctival pallor, no nasal discharge, moist mucous membranes, good dentition without erythema or discharge noted in posterior oropharynx Neck: supple, non-tender, without lymphadenopathy Cardiac: RRR, S1 S2 present. normal heart sounds, no murmurs. Respiratory: CTAB, normal effort, No wheezes, rales or rhonchi Abdomen: Normoactive bowel sounds, non-tender, non-distended, no hepatosplenomegaly Extremities: no edema or cyanosis. Skin: warm and dry, no rashes noted Neuro: alert, no obvious focal deficits Psych: Normal affect and mood  Assessment & Plan:  There are no diagnoses linked to this encounter. No orders of the defined types were placed in this encounter.  No follow-ups on file. Brian Albino, MD 01/17/2022, 12:30 PM PGY-***, [place]

## 2022-01-18 ENCOUNTER — Ambulatory Visit: Payer: Commercial Managed Care - HMO | Admitting: Family Medicine

## 2022-01-19 ENCOUNTER — Other Ambulatory Visit: Payer: Self-pay

## 2022-02-24 ENCOUNTER — Other Ambulatory Visit: Payer: Self-pay

## 2022-03-31 ENCOUNTER — Telehealth (INDEPENDENT_AMBULATORY_CARE_PROVIDER_SITE_OTHER): Payer: Commercial Managed Care - HMO | Admitting: Neurology

## 2022-03-31 ENCOUNTER — Encounter: Payer: Self-pay | Admitting: Neurology

## 2022-03-31 ENCOUNTER — Other Ambulatory Visit: Payer: Self-pay

## 2022-03-31 DIAGNOSIS — G932 Benign intracranial hypertension: Secondary | ICD-10-CM | POA: Diagnosis not present

## 2022-03-31 DIAGNOSIS — G40009 Localization-related (focal) (partial) idiopathic epilepsy and epileptic syndromes with seizures of localized onset, not intractable, without status epilepticus: Secondary | ICD-10-CM | POA: Diagnosis not present

## 2022-03-31 MED ORDER — TOPIRAMATE 25 MG PO TABS
25.0000 mg | ORAL_TABLET | Freq: Two times a day (BID) | ORAL | 11 refills | Status: DC
  Filled 2022-03-31 – 2022-04-11 (×2): qty 60, 30d supply, fill #0
  Filled 2022-06-13: qty 60, 30d supply, fill #1

## 2022-03-31 MED ORDER — OXCARBAZEPINE 600 MG PO TABS
1200.0000 mg | ORAL_TABLET | Freq: Two times a day (BID) | ORAL | 11 refills | Status: DC
  Filled 2022-03-31 – 2022-04-11 (×2): qty 120, 30d supply, fill #0
  Filled 2022-06-13: qty 120, 30d supply, fill #1

## 2022-03-31 MED ORDER — LEVETIRACETAM 500 MG PO TABS
1000.0000 mg | ORAL_TABLET | Freq: Two times a day (BID) | ORAL | 11 refills | Status: DC
  Filled 2022-03-31: qty 120, fill #0
  Filled 2022-03-31 – 2022-04-11 (×2): qty 120, 30d supply, fill #0
  Filled 2022-06-13 (×2): qty 120, 30d supply, fill #1
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
## 2022-04-03 ENCOUNTER — Ambulatory Visit: Payer: Commercial Managed Care - HMO | Admitting: Neurology

## 2022-04-07 ENCOUNTER — Other Ambulatory Visit: Payer: Self-pay

## 2022-04-11 ENCOUNTER — Other Ambulatory Visit: Payer: Self-pay

## 2022-04-25 ENCOUNTER — Other Ambulatory Visit: Payer: Self-pay

## 2022-04-25 DIAGNOSIS — H7492 Unspecified disorder of left middle ear and mastoid: Secondary | ICD-10-CM

## 2022-04-25 DIAGNOSIS — G40009 Localization-related (focal) (partial) idiopathic epilepsy and epileptic syndromes with seizures of localized onset, not intractable, without status epilepticus: Secondary | ICD-10-CM

## 2022-05-10 ENCOUNTER — Telehealth: Payer: Self-pay

## 2022-05-10 NOTE — Telephone Encounter (Signed)
Called and left message for Brian Ward to call office , that Dr. Delice Lesch would like for him to come in for labs, 05/10/22

## 2022-06-13 ENCOUNTER — Other Ambulatory Visit: Payer: Self-pay

## 2022-06-26 ENCOUNTER — Other Ambulatory Visit: Payer: Self-pay

## 2022-06-26 ENCOUNTER — Encounter (HOSPITAL_COMMUNITY): Payer: Self-pay | Admitting: Emergency Medicine

## 2022-06-26 ENCOUNTER — Emergency Department (HOSPITAL_COMMUNITY): Payer: Commercial Managed Care - HMO

## 2022-06-26 ENCOUNTER — Emergency Department (HOSPITAL_COMMUNITY)
Admission: EM | Admit: 2022-06-26 | Discharge: 2022-06-26 | Disposition: A | Discharge status: Home / Self Care | Payer: Commercial Managed Care - HMO | Attending: Emergency Medicine | Admitting: Emergency Medicine

## 2022-06-26 DIAGNOSIS — W01198A Fall on same level from slipping, tripping and stumbling with subsequent striking against other object, initial encounter: Secondary | ICD-10-CM | POA: Diagnosis not present

## 2022-06-26 DIAGNOSIS — G40909 Epilepsy, unspecified, not intractable, without status epilepticus: Secondary | ICD-10-CM | POA: Insufficient documentation

## 2022-06-26 DIAGNOSIS — R569 Unspecified convulsions: Secondary | ICD-10-CM | POA: Diagnosis present

## 2022-06-26 LAB — CBC WITH DIFFERENTIAL/PLATELET
Abs Immature Granulocytes: 0.02 10*3/uL (ref 0.00–0.07)
Basophils Absolute: 0 10*3/uL (ref 0.0–0.1)
Basophils Relative: 1 %
Eosinophils Absolute: 0.1 10*3/uL (ref 0.0–0.5)
Eosinophils Relative: 2 %
HCT: 44.8 % (ref 39.0–52.0)
Hemoglobin: 13.9 g/dL (ref 13.0–17.0)
Immature Granulocytes: 0 %
Lymphocytes Relative: 33 %
Lymphs Abs: 2 10*3/uL (ref 0.7–4.0)
MCH: 30.7 pg (ref 26.0–34.0)
MCHC: 31 g/dL (ref 30.0–36.0)
MCV: 98.9 fL (ref 80.0–100.0)
Monocytes Absolute: 0.5 10*3/uL (ref 0.1–1.0)
Monocytes Relative: 9 %
Neutro Abs: 3.3 10*3/uL (ref 1.7–7.7)
Neutrophils Relative %: 55 %
Platelets: 269 10*3/uL (ref 150–400)
RBC: 4.53 MIL/uL (ref 4.22–5.81)
RDW: 14.8 % (ref 11.5–15.5)
WBC: 6 10*3/uL (ref 4.0–10.5)
nRBC: 0 % (ref 0.0–0.2)

## 2022-06-26 LAB — COMPREHENSIVE METABOLIC PANEL
ALT: 17 U/L (ref 0–44)
AST: 22 U/L (ref 15–41)
Albumin: 4.5 g/dL (ref 3.5–5.0)
Alkaline Phosphatase: 107 U/L (ref 38–126)
Anion gap: 18 — ABNORMAL HIGH (ref 5–15)
BUN: 14 mg/dL (ref 6–20)
CO2: 14 mmol/L — ABNORMAL LOW (ref 22–32)
Calcium: 9.4 mg/dL (ref 8.9–10.3)
Chloride: 104 mmol/L (ref 98–111)
Creatinine, Ser: 1.2 mg/dL (ref 0.61–1.24)
GFR, Estimated: >60 mL/min (ref >60)
Glucose, Bld: 213 mg/dL — ABNORMAL HIGH (ref 70–99)
Potassium: 4.4 mmol/L (ref 3.5–5.1)
Sodium: 136 mmol/L (ref 135–145)
Total Bilirubin: 0.6 mg/dL (ref 0.3–1.2)
Total Protein: 7.7 g/dL (ref 6.5–8.1)

## 2022-06-26 LAB — ETHANOL: Alcohol, Ethyl (B): <10 mg/dL (ref <10)

## 2022-06-26 LAB — CBG MONITORING, ED: Glucose-Capillary: 196 mg/dL — ABNORMAL HIGH (ref 70–99)

## 2022-06-26 LAB — MAGNESIUM: Magnesium: 2 mg/dL (ref 1.7–2.4)

## 2022-06-26 MED ORDER — OXCARBAZEPINE 300 MG PO TABS
1200.0000 mg | ORAL_TABLET | Freq: Once | ORAL | Status: AC
  Administered 2022-06-26: 1200 mg via ORAL
  Filled 2022-06-26: qty 4

## 2022-06-26 MED ORDER — LACTATED RINGERS IV BOLUS
2000.0000 mL | Freq: Once | INTRAVENOUS | Status: AC
  Administered 2022-06-26: 2000 mL via INTRAVENOUS

## 2022-06-26 NOTE — Discharge Instructions (Addendum)
Your workup today was overall reassuring.  Ensure that you are taking your seizure medications.  Please keep your appointment with your neurologist from May 23.  No concerning findings on the workup today.  CT scan of the head and neck was overall reassuring.  You do have a hematoma on the side of your head.  You can ice this area.  Take Tylenol and ibuprofen as you need to for pain control.  For any concerning symptoms return to the emergency room.

## 2022-06-26 NOTE — ED Provider Notes (Signed)
[facility] EMERGENCY DEPARTMENT AT [facility] Provider Note   CSN: [id number] Arrival date & time: 06/26/22  1018     History  Chief Complaint  Patient presents with   Seizures    Brian Ward is a 47 y.o. male.  47 year old male with past medical history significant for seizures presents today for evaluation of a seizure that occurred prior to arrival.  He was outside talking to his neighbor when he had this episode.  Lasted about 8 minutes according to the neighbor.  He reports compliance with his medications however has not taken his Trileptal today.  He does have abrasions to the right side of his forehead and face.  No laceration or active bleeding.  Denies other complaints.  Denies recent illness.  The history is provided by the patient. No language interpreter was used.       Home Medications Prior to Admission medications   Medication Sig Start Date End Date Taking? Authorizing Provider  levETIRAcetam (KEPPRA) 500 MG tablet Take 2 tablets (1,000 mg total) by mouth 2 (two) times daily. 03/31/22   Van Clines, MD  oxcarbazepine (TRILEPTAL) 600 MG tablet Take 2 tablets (1,200 mg total) by mouth 2 (two) times daily. 03/31/22   Van Clines, MD  topiramate (TOPAMAX) 25 MG tablet Take 1 tablet (25 mg total) by mouth 2 (two) times daily. 03/31/22   Van Clines, MD      Allergies    Depakote [divalproex sodium] and Pork-derived products    Review of Systems   Review of Systems  Constitutional:  Negative for chills and fever.  Respiratory:  Negative for shortness of breath.   Gastrointestinal:  Negative for abdominal pain.  Neurological:  Positive for seizures.  All other systems reviewed and are negative.   Physical Exam Updated Vital Signs BP 124/77   Pulse 95   Temp 98.3 F (36.8 C)   Resp 18   Ht 5\' 8"  (1.727 m)   Wt 95.3 kg   SpO2 95%   BMI 31.93 kg/m  Physical Exam Vitals and nursing note reviewed.  Constitutional:      General: He is  not in acute distress.    Appearance: Normal appearance. He is not ill-appearing.  HENT:     Head: Normocephalic and atraumatic.     Nose: Nose normal.  Eyes:     General: No scleral icterus.    Extraocular Movements: Extraocular movements intact.     Conjunctiva/sclera: Conjunctivae normal.  Cardiovascular:     Rate and Rhythm: Normal rate and regular rhythm.     Heart sounds: Normal heart sounds.  Pulmonary:     Effort: Pulmonary effort is normal. No respiratory distress.     Breath sounds: Normal breath sounds. No wheezing or rales.  Abdominal:     General: There is no distension.     Tenderness: There is no abdominal tenderness.  Musculoskeletal:        General: Normal range of motion.     Cervical back: Normal range of motion.  Skin:    General: Skin is warm and dry.  Neurological:     General: No focal deficit present.     Mental Status: He is alert and oriented to person, place, and time. Mental status is at baseline.     Comments: 3 through 12 intact.  Full range of motion bilateral upper and Ultralente with 5/5 strength.  No facial droop.  No pronator drift.     ED Results /  Procedures / Treatments   Labs (all labs ordered are listed, but only abnormal results are displayed) Labs Reviewed  CBG MONITORING, ED - Abnormal; Notable for the following components:      Result Value   Glucose-Capillary 196 (*)    All other components within normal limits  LEVETIRACETAM LEVEL  CBC WITH DIFFERENTIAL/PLATELET  COMPREHENSIVE METABOLIC PANEL  MAGNESIUM  ETHANOL  RAPID URINE DRUG SCREEN, HOSP PERFORMED    EKG EKG Interpretation  Date/Time:  Monday Jun 26 2022 10:24:29 EDT Ventricular Rate:  99 PR Interval:  158 QRS Duration: 102 QT Interval:  364 QTC Calculation: 467 R Axis:   81 Text Interpretation: Normal sinus rhythm with sinus arrhythmia Normal ECG When compared with ECG of 13-Jul-2021 17:16, PREVIOUS ECG IS PRESENT Confirmed by Gerhard Munch [phone]) on  06/26/2022 10:26:56 AM  Radiology No results found.  Procedures Procedures    Medications Ordered in ED Medications  lactated ringers bolus 2,000 mL (has no administration in time range)    ED Course/ Medical Decision Making/ A&P                             Medical Decision Making Amount and/or Complexity of Data Reviewed Labs: ordered. Radiology: ordered.  Risk Prescription drug management.   47 year old male presents today for concern of seizure.  He has history.  He is compliant with his medications.  Currently at his baseline.  Neurological exam without focal deficits.  Will obtain CT imaging, and blood work.  CT imaging without acute intracranial or cervical concern.  CBC is unremarkable, CMP shows glucose of 213, CO2 of 14, and an anion gap of 18.  2 L fluid bolus given.  Trileptal given.  Remained at his baseline emergency room stay.  Patient has close follow-up with neurology scheduled for 5/23.  He would like to be discharged.  Discussed return precautions and follow-up with neurology.  Patient is in agreement with plan.  Discharged in stable condition.   Final Clinical Impression(s) / ED Diagnoses Final diagnoses:  Seizure-like activity [facility])    Rx / DC Orders ED Discharge Orders     None         Marita Kansas, PA-C 06/26/22 1437    Gerhard Munch, MD 06/26/22 1540

## 2022-06-26 NOTE — ED Triage Notes (Signed)
Per [facility] pt coming from home- states patient was outside speaking with neighbor then had a seizure and fell backwards. Hit back of head on ground. . Abrasion to right side of head. Neighbor states seizure lasted about 8 minutes. C-collar in place. Takes keppra.

## 2022-06-27 LAB — LEVETIRACETAM LEVEL: Levetiracetam Lvl: <2 ug/mL — ABNORMAL LOW (ref 10.0–40.0)

## 2022-07-10 ENCOUNTER — Other Ambulatory Visit: Payer: Self-pay

## 2022-07-10 ENCOUNTER — Telehealth: Payer: Self-pay

## 2022-07-10 ENCOUNTER — Other Ambulatory Visit (INDEPENDENT_AMBULATORY_CARE_PROVIDER_SITE_OTHER): Payer: Commercial Managed Care - HMO

## 2022-07-10 ENCOUNTER — Ambulatory Visit (INDEPENDENT_AMBULATORY_CARE_PROVIDER_SITE_OTHER): Payer: Commercial Managed Care - HMO | Admitting: Neurology

## 2022-07-10 ENCOUNTER — Encounter: Payer: Self-pay | Admitting: Neurology

## 2022-07-10 VITALS — BP 119/78 | HR 88 | Resp 20 | Wt 171.0 lb

## 2022-07-10 DIAGNOSIS — G40009 Localization-related (focal) (partial) idiopathic epilepsy and epileptic syndromes with seizures of localized onset, not intractable, without status epilepticus: Secondary | ICD-10-CM

## 2022-07-10 DIAGNOSIS — G932 Benign intracranial hypertension: Secondary | ICD-10-CM

## 2022-07-10 DIAGNOSIS — H7492 Unspecified disorder of left middle ear and mastoid: Secondary | ICD-10-CM

## 2022-07-10 MED ORDER — LEVETIRACETAM ER 500 MG PO TB24
2000.0000 mg | ORAL_TABLET | Freq: Every day | ORAL | 11 refills | Status: AC
Start: 2022-07-10 — End: ?
  Filled 2022-07-10: qty 120, 30d supply, fill #0
  Filled 2022-09-01: qty 120, 30d supply, fill #1
  Filled 2022-10-19: qty 120, 30d supply, fill #2
  Filled 2022-11-02: qty 120, 30d supply, fill #3

## 2022-07-10 MED ORDER — TOPIRAMATE 25 MG PO TABS
25.0000 mg | ORAL_TABLET | Freq: Every day | ORAL | 11 refills | Status: AC
Start: 2022-07-10 — End: ?
  Filled 2022-07-10: qty 30, 30d supply, fill #0

## 2022-07-10 MED ORDER — OXCARBAZEPINE 600 MG PO TABS
1200.0000 mg | ORAL_TABLET | Freq: Two times a day (BID) | ORAL | 11 refills | Status: AC
Start: 2022-07-10 — End: ?
  Filled 2022-07-10: qty 120, 30d supply, fill #0
  Filled 2022-09-15: qty 120, 30d supply, fill #1
  Filled 2022-11-02: qty 120, 30d supply, fill #2
  Filled 2022-11-02: qty 120, 30d supply, fill #0
  Filled 2022-11-02: qty 120, 30d supply, fill #2
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
## 2022-07-10 NOTE — Telephone Encounter (Signed)
Referral for ENT sent to [facility] phone number [phone] fax number [phone] Referral for opthalmology sent to [facility] ophthalmology phone number [phone] fax number [phone]

## 2022-07-10 NOTE — Addendum Note (Signed)
Addended by: Allean Found R on: 07/10/2022 10:35 AM   Modules accepted: Orders

## 2022-07-10 NOTE — Patient Instructions (Addendum)
Good to see you.  Have bloodwork done this morning for Keppra, Oxcarbazepine, and Topamax levels  2. We will switch to the extended-release Levetiracetam (Keppra) 500mg : Take 4 tablets every night  3. Continue Oxcarbazepine 600mg : take 2 tablets twice a day  4. Continue Topiramate 25mg  once a day  5. Schedule repeat spinal tap to measure pressure  6. Referral will be sent to the ENT specialist  7. Referral will be sent to the eye doctor  8. Follow-up in 3-4 months, call for any changes   Seizure Precautions: 1. If medication has been prescribed for you to prevent seizures, take it exactly as directed.  Do not stop taking the medicine without talking to your doctor first, even if you have not had a seizure in a long time.   2. Avoid activities in which a seizure would cause danger to yourself or to others.  Don't operate dangerous machinery, swim alone, or climb in high or dangerous places, such as on ladders, roofs, or girders.  Do not drive unless your doctor says you may.  3. If you have any warning that you may have a seizure, lay down in a safe place where you can't hurt yourself.    4.  No driving for 6 months from last seizure, as per [facility].   Please refer to the following link on the Epilepsy Foundation of America's website for more information: http://www.epilepsyfoundation.org/answerplace/Social/driving/drivingu.cfm   5.  Maintain good sleep hygiene. Avoid alcohol.  6.  Contact your doctor if you have any problems that may be related to the medicine you are taking.  7.  Call 911 and bring the patient back to the ED if:        A.  The seizure lasts longer than 5 minutes.       B.  The patient doesn't awaken shortly after the seizure  C.  The patient has new problems such as difficulty seeing, speaking or moving  D.  The patient was injured during the seizure  E.  The patient has a temperature over 102 F (39C)  F.  The patient vomited and now is having  trouble breathing       Labs today [address] [facility] for Lumbar spine tap

## 2022-07-10 NOTE — Addendum Note (Signed)
Addended by: Allean Found R on: 07/10/2022 10:36 AM   Modules accepted: Orders

## 2022-07-11 ENCOUNTER — Other Ambulatory Visit: Payer: Self-pay

## 2022-07-13 LAB — 10-HYDROXYCARBAZEPINE: Triliptal/MTB(Oxcarbazepin): 31.6 ug/mL (ref 8.0–35.0)

## 2022-07-14 LAB — LEVETIRACETAM LEVEL: Keppra (Levetiracetam): 23.2 ug/mL

## 2022-07-14 LAB — TOPIRAMATE LEVEL: Topiramate Lvl: <0.5 ug/mL — ABNORMAL LOW

## 2022-08-04 NOTE — ED Provider Notes (Signed)
 [facility] Ward [facility]  ED Provider Note  Brian Ward 47 y.o. male DOB: 08-08-1975 MRN: [id number] History   Chief Complaint  Patient presents with  . Dizziness    Hx epilepsy. Increased dizziness throughout the day with palpitations. No seizures in 3-4 weeks. Compliant with medications. No SOB or chest pain. No recent illness however pt reports recent increase in stress.    47 year old male with history of epilepsy, on multiple antiepileptic medications presenting for evaluation of prodrome of seizure.  Patient reports he felt lightheaded and dizzy and off today and felt as though he might have a seizure.  He has been doing with increased stress recently related to breaking up with his girlfriend.  He also has been dealing with stress related to not having an income and not having a job.  He is visiting currently from [place] and staying at his sister's house.  He denies any fevers, chills, vomiting, actual seizures.  Reports compliance with all of his medications.  He does admit to [facility] usage but denies any ethanol usage.      No past medical history on file.  No past surgical history on file.  Social History   Substance and Sexual Activity  Alcohol Use Not on file   Social History   Tobacco Use  Smoking Status Not on file  Smokeless Tobacco Not on file   No existing history information found. Social History   Substance and Sexual Activity  Drug Use Not on file         Not on File  Discharge Medication List as of 08/04/2022  4:19 PM      Review of Systems   Review of Systems  Constitutional:  Negative for chills and fever.  HENT:  Negative for ear pain and sore throat.   Eyes:  Negative for pain and visual disturbance.  Respiratory:  Negative for cough and shortness of breath.   Cardiovascular:  Negative for chest pain and palpitations.  Gastrointestinal:  Negative for abdominal pain and vomiting.  Genitourinary:  Negative for dysuria and  hematuria.  Musculoskeletal:  Negative for arthralgias and back pain.  Skin:  Negative for color change and rash.  Neurological:  Positive for dizziness. Negative for seizures and syncope.  All other systems reviewed and are negative.   Physical Exam   ED Triage Vitals [08/04/22 1330]  BP 122/84  Heart Rate 69  Resp 16  SpO2 100 %  Temp 97.8 F (36.6 C)    Physical Exam  Nursing note and vitals reviewed. Constitutional: He appears well-developed and well-nourished.  HENT:  Head: Normocephalic and atraumatic.  Right Ear: Normal external ear.  Left Ear: Normal external ear.  Nose: Nose normal.  Mouth/Throat: Voice normal.  Eyes: EOM are intact. Pupils are equal, round, and reactive to light.  Neck: Normal range of motion and voice normal. Neck supple.  Cardiovascular: Normal rate and regular rhythm.  Pulmonary/Chest: Respiratory effort normal. No visible chest trauma.  Abdominal: Soft. No visible abdominal trauma.  Musculoskeletal: Normal range of motion. No visible trauma noted on back exam. No obvious deformity noted to extremities.     Cervical back: Normal range of motion and neck supple.   Neurological: He is alert and oriented to person, place, and time. Gait normal.  Patient is alert and oriented.  Possibly some mild right conjunctival injection  Skin: Skin is warm. Skin is dry.  Psychiatric: He has a normal mood and affect. His behavior is normal.  ED Course   Lab results:   CBC AND DIFFERENTIAL - Abnormal      Result Value   WBC 4.4     RBC 4.38 (*)    HGB 13.4 (*)    HCT 41.2     MCV 94.1 (*)    MCH 30.6     MCHC 32.5     Plt Ct 243     RDW SD 47.6 (*)    MPV 8.8 (*)    NRBC% 0.0     Absolute NRBC Count 0.00     NEUTROPHIL % 39.4     LYMPHOCYTE % 41.4     MONOCYTE % 14.0     Eosinophil % 4.5     BASOPHIL % 0.7     IG% 0.0     ABSOLUTE NEUTROPHIL COUNT 1.74     ABSOLUTE LYMPHOCYTE COUNT 1.83     Absolute Monocyte Count 0.62     Absolute  Eosinophil Count 0.20     Absolute Basophil Count 0.03     Absolute Immature Granulocyte Count 0.00    BASIC METABOLIC PANEL   Na 140     Potassium 4.2     Cl 103     CO2 28     AGAP 9     Glucose 98     BUN 10     Creatinine 0.77     Ca 9.4     BUN/CREAT RATIO 13.0     eGFR 110     Comment: Normal GFR (glomerular filtration rate) > 60 mL/min/1.73 meters squared, < 60 may include impaired kidney function. Calculation based on the Chronic Kidney Disease Epidemiology Collaboration (CK-EPI)equation refit without adjustment for race.  POCT GLUCOSE  POCT GLUCOSE   Glucose, POC 99     OPERATOR ID [id number]     INSTRUMENT ID [id number]      Imaging:   CT HEAD WO CONTRAST   Narrative:    HISTORY: Seizure COMPARISON: None available. TECHNIQUE: Axial scans were obtained through the brain without contrast. CT dose reduction techniques were utilized for this exam.  FINDINGS: The ventricles and sulci are symmetric in appearance and within normal limits for age. No definite focal lesion, mass effect, or hemorrhage. No extra-axial fluid collection. No acute infarction seen. Mildly enlarged partially empty sella appearance. The visualized portions of the orbits and paranasal sinuses are unremarkable. There is opacification of the left mastoids and there may be partial opacification of the middle ear cavity, though difficult to confirm on these images.   Impression:    IMPRESSION:  No acute intracranial pathology on noncontrast brain CT. Left mastoid opacification and empty sella appearance with mild enlargement. Electronically Signed by: Lamar EMERSON Shine, MD, FACR on 08/04/2022 2:49 PM    ECG: ECG Results          ECG 12 lead (Final result)  Result time 08/04/22 14:15:09    Final result             Narrative:   Diagnosis Class Borderline Abnormal Acquisition Device D3K Systolic BP 142 Diastolic BP 80 Ventricular Rate 71 Atrial Rate 71 P-R Interval 166 QRS Duration 98 Q-T  Interval 400 QTC Calculation(Bazett) 434 Calculated P Axis 80 Calculated R Axis 70 Calculated T Axis 60  Diagnosis Normal sinus rhythm with sinus arrhythmia Normal ECG When compared with ECG of 04-Aug-2022 13:33, No significant change was found [name], Milap ([id number]) on 08/04/2022 2:14:59 PM certifies that he/she has reviewed the ECG tracing and confirms  the independent  interpretation is correct.                                                 Pre-Sedation Procedures    Medical Decision Making 47 year old male presenting for evaluation of with prodrome of seizures.  Differential includes dizziness, headedness, acute stress, anxiety attack, panic attack, epilepsy, among others.  Patient with no seizure-like activity in the emergency department during a period of 3 and half hours of observation.  CT of the head by my independent interpretation without any evidence of intracranial hemorrhage.  Radiology reports that shows some chronic findings  I did review care everywhere note from his neurologist in [place] with Dr. Georjean.  He is on several antiepileptic drugs.  Reports compliance.  I did offer checking levels of oxcarbazepine  and Keppra  however this will take several days for them to come back and patient is planning going back to [facility] so checking levels likely to be of limited utility at this time especially as the patient has not had a seizure.  Recommend continued hydration, getting of sleep, managing stress level, continue compliance with antiepileptic drugs.  Recommend close follow-up with his neurologist back home in [place].  ER return precautions given otherwise stable for discharge.  Patient is had some mild conjunctival injection of his right eyes will give further mycin for possible conjunctivitis.  Amount and/or Complexity of Data Reviewed Labs: ordered. Radiology: ordered. ECG/medicine tests: ordered.  Risk Prescription drug  management.       Provider Communication  Discharge Medication List as of 08/04/2022  4:19 PM     START taking these medications   Details  erythromycin (ILOTYCIN) ophthalmic ointment Place a 1/2 inch ribbon of ointment into the lower eyelid., Normal        Discharge Medication List as of 08/04/2022  4:19 PM      Discharge Medication List as of 08/04/2022  4:19 PM      Clinical Impression   Final diagnoses:  Lightheadedness  Conjunctivitis of right eye, unspecified conjunctivitis type    ED Disposition     ED Disposition  Discharge   Condition  Stable   Comment  --                 Follow-up Information     Darice CHRISTELLA Georjean, MD. Schedule an appointment as soon as possible for a visit in 3 days.   Contact information: [address] [place] [address] [phone]                  Electronically signed by:    Theophilus SHAUNNA Queen, MD 08/04/22 1650

## 2022-08-14 ENCOUNTER — Telehealth: Payer: Self-pay

## 2022-08-14 ENCOUNTER — Other Ambulatory Visit: Payer: Self-pay

## 2022-08-14 DIAGNOSIS — G932 Benign intracranial hypertension: Secondary | ICD-10-CM

## 2022-08-14 NOTE — Telephone Encounter (Signed)
-----   Message from Van Clines, MD sent at 08/14/2022 10:42 AM EDT ----- Can you pls follow-up with him about scheduling the repeat spinal tap to measure opening pressure? thanks

## 2022-08-14 NOTE — Telephone Encounter (Signed)
Pt called about scheduling the repeat spinal tap to measure opening pressure? He now has insurance and would like to get that set up

## 2022-08-14 NOTE — Telephone Encounter (Signed)
Looks like we ordered it last month, do we need to order again? Pls order LP under fluoro, measure opening pressure, send CSF for cell count, glucose, protein. Thanks!

## 2022-08-14 NOTE — Telephone Encounter (Signed)
New order placed in epic

## 2022-09-01 ENCOUNTER — Other Ambulatory Visit: Payer: Self-pay

## 2022-09-04 ENCOUNTER — Other Ambulatory Visit: Payer: Self-pay

## 2022-09-11 ENCOUNTER — Telehealth: Payer: Self-pay | Admitting: Neurology

## 2022-09-11 NOTE — Telephone Encounter (Signed)
Patient is reaching out for group therapy , or something in that nature.

## 2022-09-11 NOTE — Telephone Encounter (Signed)
Pt called to find out more information about what he Is needing

## 2022-09-12 NOTE — Telephone Encounter (Signed)
My chart message sent to pt to see what type  therapy he is needing

## 2022-09-12 NOTE — Telephone Encounter (Signed)
Pt called to find out more information about what he Is needing

## 2022-09-15 ENCOUNTER — Other Ambulatory Visit: Payer: Self-pay

## 2022-09-24 IMAGING — CT CT HEAD W/O CM
3 series · 15 of 47 positions shown, 18 images · non-contrast
Comparison: 04/25/2021

CLINICAL DATA: Trauma, seizures, altered mental status



[Series 3: head 5.0 h30s · axial · 0.46mm/px · z∈[-83,+52]mm · 9 of 33 slices shown, 12 images]
[im 3/33  brain]
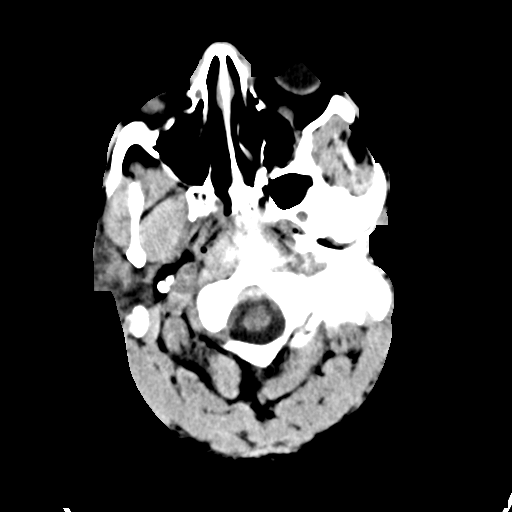
[im 3/33  bone]
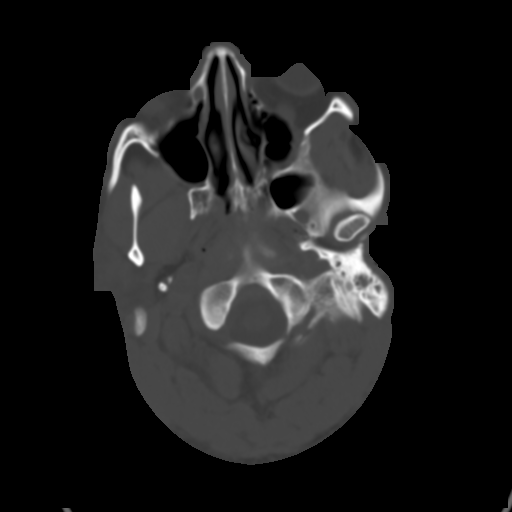
[im 6/33  brain]
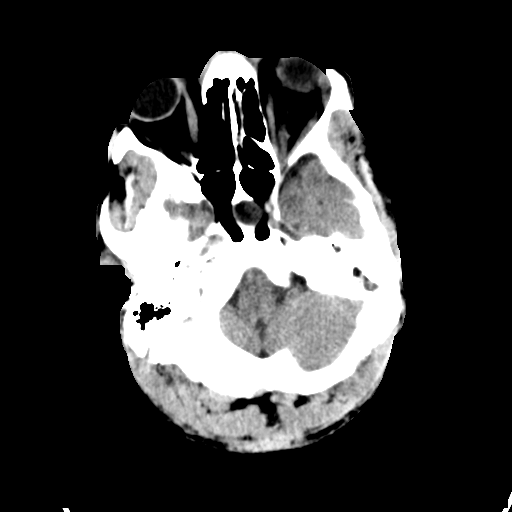
[im 9/33  brain]
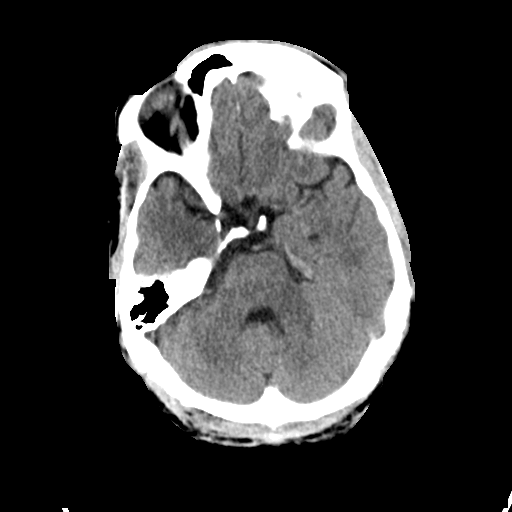
[im 13/33  brain]
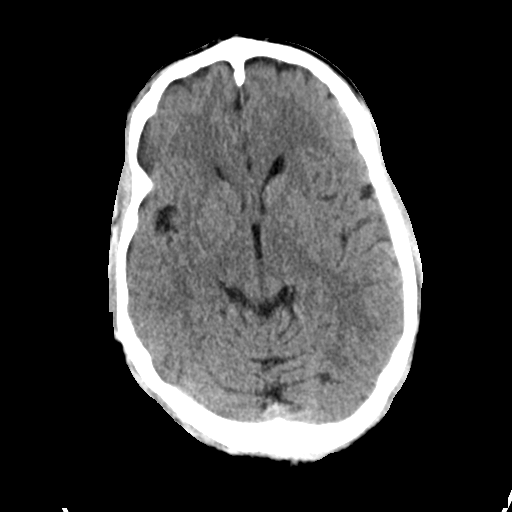
[im 17/33  brain]
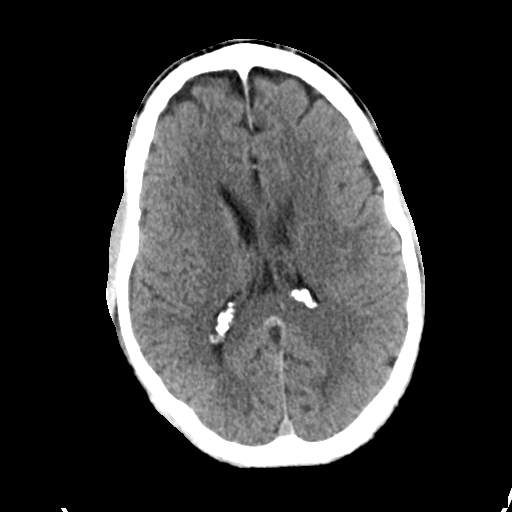
[im 17/33  bone]
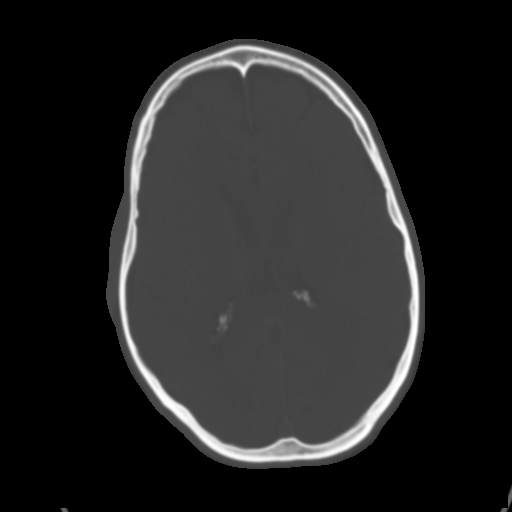
[im 20/33  brain]
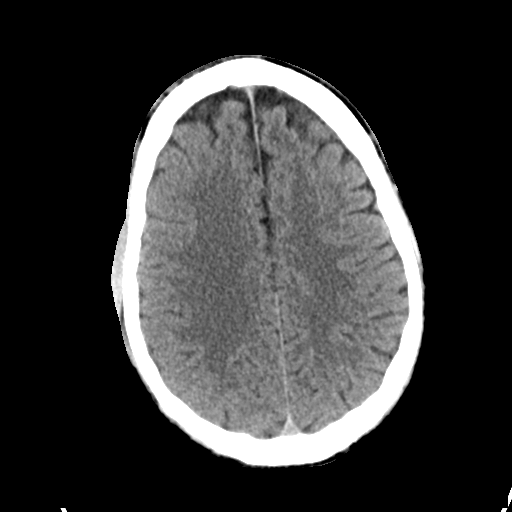
[im 24/33  brain]
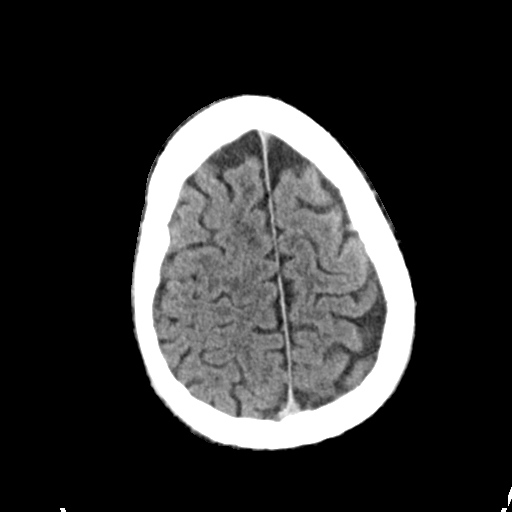
[im 27/33  brain]
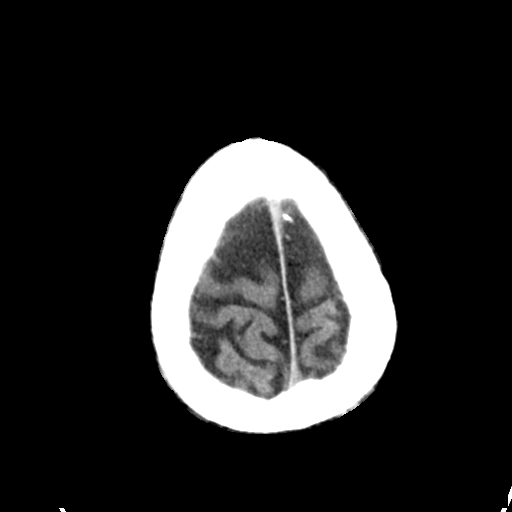
[im 30/33  brain]
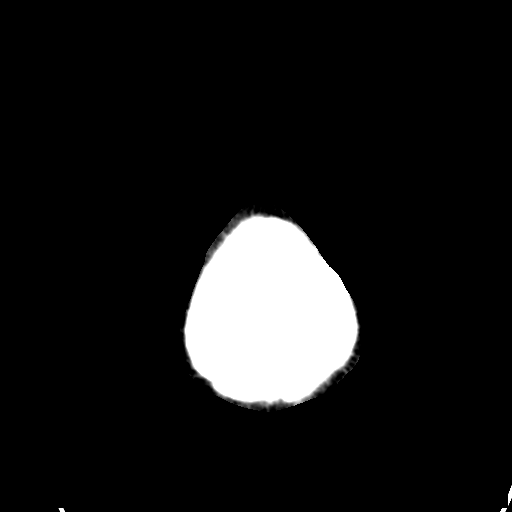
[im 30/33  bone]
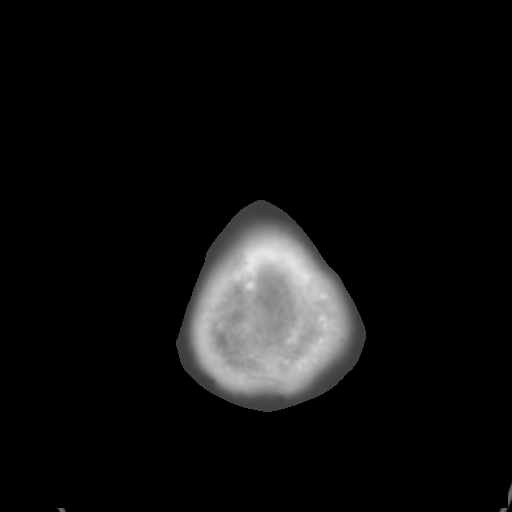

[Series 5: head 3.0 mpr cor · coronal · 0.34mm/px · 3 of 78 slices shown]
[im 26/78  brain]
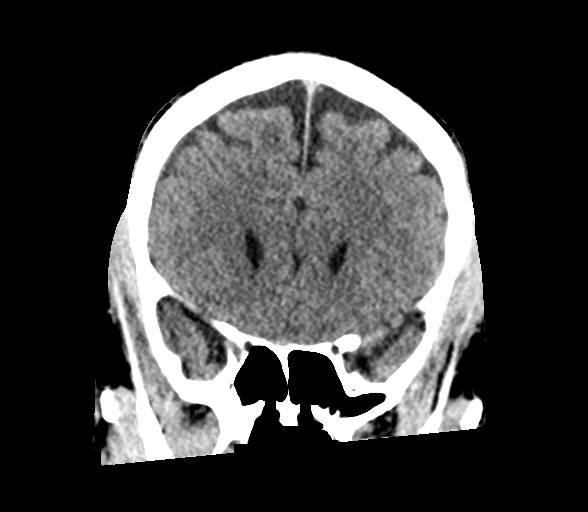
[im 35/78  brain]
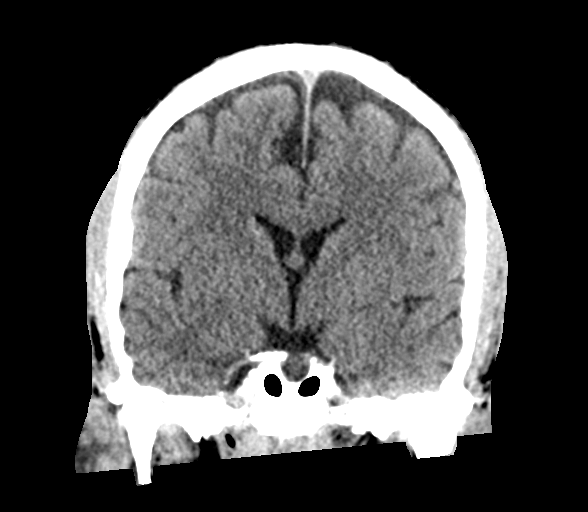
[im 43/78  brain]
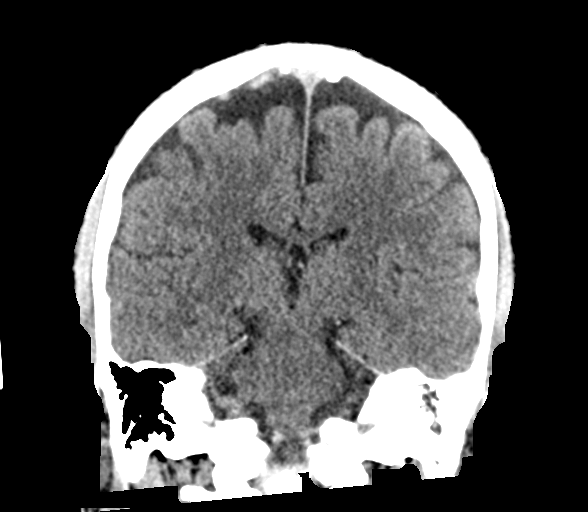

[Series 6: head 3.0 mpr sag · sagittal · 0.35mm/px · 3 of 61 slices shown]
[im 21/61  brain]
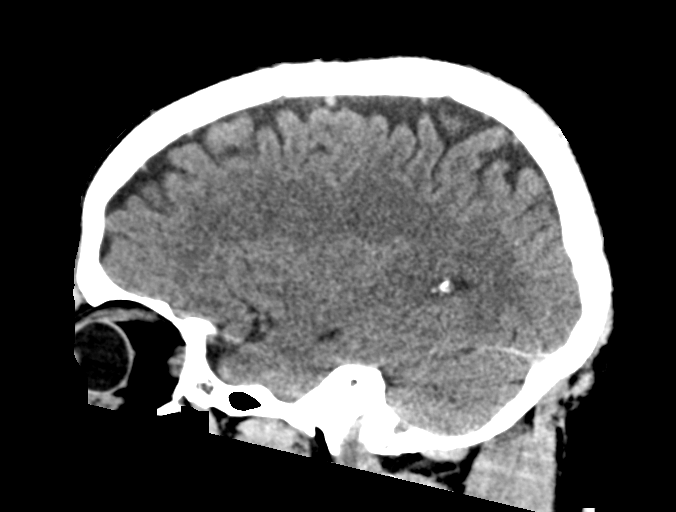
[im 31/61  brain]
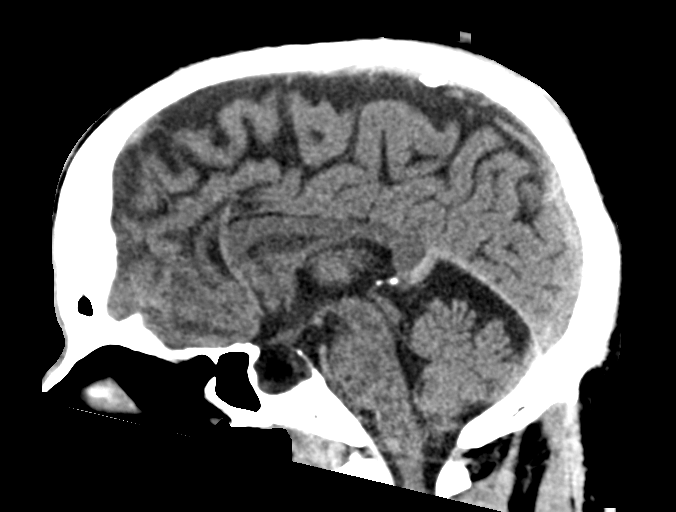
[im 41/61  brain]
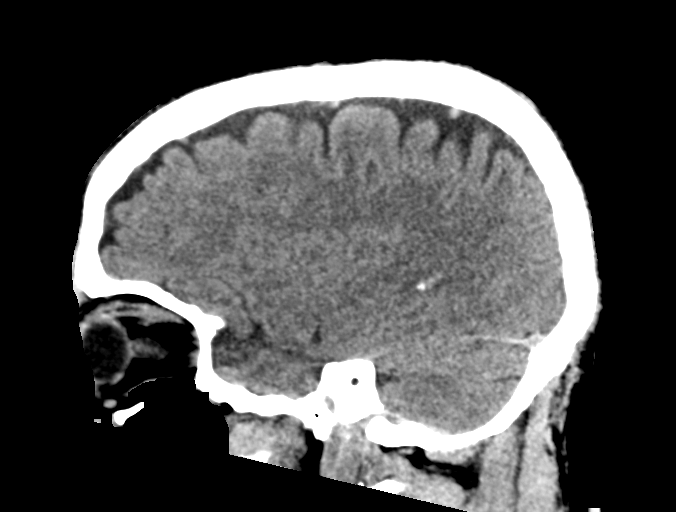

[15 of 47 positions shown; findings below may reference images not displayed]

FINDINGS: Brain: No acute intracranial findings are seen. Ventricles are not
dilated. There is no shift of midline structures. Cortical sulci are
prominent. There is no focal mass effect.

Vascular: Unremarkable.

Skull: Unremarkable.

Sinuses/Orbits: There is left mastoid effusion. There is mild
mucosal thickening in the ethmoid and maxillary sinuses.

Other: There is increased amount of CSF insula suggesting partial
empty sella.
IMPRESSION: No acute intracranial findings are seen.  Atrophy.

Chronic sinusitis.  Chronic left mastoid effusion.

## 2022-10-06 ENCOUNTER — Other Ambulatory Visit: Payer: Self-pay

## 2022-10-10 ENCOUNTER — Other Ambulatory Visit: Payer: Self-pay

## 2022-10-19 ENCOUNTER — Other Ambulatory Visit: Payer: Self-pay

## 2022-11-02 ENCOUNTER — Other Ambulatory Visit: Payer: Self-pay

## 2022-11-02 ENCOUNTER — Telehealth: Payer: Self-pay | Admitting: Neurology

## 2022-11-02 ENCOUNTER — Other Ambulatory Visit (HOSPITAL_COMMUNITY): Payer: Self-pay

## 2022-11-02 DIAGNOSIS — F99 Mental disorder, not otherwise specified: Secondary | ICD-10-CM

## 2022-11-02 NOTE — Telephone Encounter (Signed)
Pt called in wanting to see if DR. Karel Jarvis could recommend a psychiatrist? He is worried about his mental health.

## 2022-11-03 ENCOUNTER — Other Ambulatory Visit: Payer: Self-pay

## 2022-11-07 NOTE — Addendum Note (Signed)
Addended by: Dimas Chyle on: 11/07/2022 12:42 PM   Modules accepted: Orders

## 2022-11-07 NOTE — Telephone Encounter (Signed)
Pls refer to [facility]. Also, pls let him know I received a letter from his insurance that he has not been refilling medications regularly. Pls remind him it is very important to take his medications regularly as prescribed. Thanks

## 2022-11-07 NOTE — Telephone Encounter (Signed)
Pt called no answer when calls back need to let him know that Dr Karel Jarvis received a letter from his insurance that he has not been refilling medications regularly. Pls remind him it is very important to take his medications regularly as prescribed. Behavior health referral placed in epic

## 2022-11-08 ENCOUNTER — Other Ambulatory Visit (HOSPITAL_COMMUNITY): Payer: Self-pay

## 2022-11-08 ENCOUNTER — Other Ambulatory Visit: Payer: Self-pay

## 2022-11-08 ENCOUNTER — Encounter: Payer: Self-pay | Admitting: Neurology

## 2022-11-08 ENCOUNTER — Ambulatory Visit (INDEPENDENT_AMBULATORY_CARE_PROVIDER_SITE_OTHER): Payer: Managed Care, Other (non HMO) | Admitting: Neurology

## 2022-11-08 VITALS — BP 141/80 | HR 78 | Ht 68.5 in | Wt 174.8 lb

## 2022-11-08 DIAGNOSIS — H7492 Unspecified disorder of left middle ear and mastoid: Secondary | ICD-10-CM | POA: Diagnosis not present

## 2022-11-08 DIAGNOSIS — G40009 Localization-related (focal) (partial) idiopathic epilepsy and epileptic syndromes with seizures of localized onset, not intractable, without status epilepticus: Secondary | ICD-10-CM | POA: Diagnosis not present

## 2022-11-08 DIAGNOSIS — G932 Benign intracranial hypertension: Secondary | ICD-10-CM

## 2022-11-08 MED ORDER — LEVETIRACETAM ER 500 MG PO TB24
2000.0000 mg | ORAL_TABLET | Freq: Every day | ORAL | 11 refills | Status: DC
Start: 2022-11-08 — End: 2022-11-08
  Filled 2022-11-08: qty 120, 30d supply, fill #0

## 2022-11-08 MED ORDER — OXCARBAZEPINE 600 MG PO TABS
1200.0000 mg | ORAL_TABLET | Freq: Two times a day (BID) | ORAL | 11 refills | Status: DC
Start: 2022-11-08 — End: 2023-03-13
  Filled 2022-11-08 – 2022-12-12 (×2): qty 120, 30d supply, fill #0
  Filled 2023-02-02: qty 120, 30d supply, fill #1

## 2022-11-08 MED ORDER — LEVETIRACETAM ER 500 MG PO TB24
ORAL_TABLET | ORAL | 11 refills | Status: DC
Start: 2022-11-08 — End: 2023-03-13
  Filled 2022-11-08: qty 150, fill #0
  Filled 2022-12-14 (×3): qty 150, 30d supply, fill #0
  Filled 2023-02-01: qty 150, 30d supply, fill #1
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
## 2022-11-08 NOTE — Telephone Encounter (Signed)
Pt called no answer when calls back need to let him know that Dr Karel Jarvis received a letter from his insurance that he has not been refilling medications regularly. Pls remind him it is very important to take his medications regularly as prescribed. Behavior health referral placed in epic
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
## 2022-11-08 NOTE — Progress Notes (Signed)
Seizure 11/01/22 . Has increase in stress,  Pt c/o: seizure Missed medications?  No. [place] Spaniel took it 30 mins late  Sleep deprived?  Yes.   Alcohol intake?  No. Increased stress? Yes.   Any change in medication color or shape? No. Any triggers? Stress  Back to their usual baseline self?  Yes.  . If no, advise go to ER Current medications prescribed by Dr. Karel Jarvis:  levetiracetam, oxcarbazepine,   Is not taken the topiramate said make him angry asked for that to be put on allergy list,    Fells like it takes a while for his medication to start working said he took it at 8:30 and its 10:57 and fells like it hasn't started working yet he is scared

## 2022-11-08 NOTE — Telephone Encounter (Signed)
Pt was in the office an  informed that Behavior health referral was placed

## 2022-11-21 ENCOUNTER — Emergency Department (HOSPITAL_COMMUNITY)
Admission: EM | Admit: 2022-11-21 | Discharge: 2022-11-22 | Discharge status: Against Medical Advice | Payer: Commercial Managed Care - HMO | Attending: Emergency Medicine | Admitting: Emergency Medicine

## 2022-11-21 ENCOUNTER — Other Ambulatory Visit: Payer: Self-pay

## 2022-11-21 ENCOUNTER — Encounter (HOSPITAL_COMMUNITY): Payer: Self-pay | Admitting: Emergency Medicine

## 2022-11-21 ENCOUNTER — Emergency Department (HOSPITAL_COMMUNITY): Payer: Commercial Managed Care - HMO

## 2022-11-21 DIAGNOSIS — R079 Chest pain, unspecified: Secondary | ICD-10-CM | POA: Insufficient documentation

## 2022-11-21 DIAGNOSIS — Z5321 Procedure and treatment not carried out due to patient leaving prior to being seen by health care provider: Secondary | ICD-10-CM | POA: Diagnosis not present

## 2022-11-21 LAB — BASIC METABOLIC PANEL
Anion gap: 8 (ref 5–15)
BUN: 7 mg/dL (ref 6–20)
CO2: 24 mmol/L (ref 22–32)
Calcium: 9 mg/dL (ref 8.9–10.3)
Chloride: 103 mmol/L (ref 98–111)
Creatinine, Ser: 0.89 mg/dL (ref 0.61–1.24)
GFR, Estimated: >60 mL/min (ref >60)
Glucose, Bld: 97 mg/dL (ref 70–99)
Potassium: 3.8 mmol/L (ref 3.5–5.1)
Sodium: 135 mmol/L (ref 135–145)

## 2022-11-21 LAB — CBC
HCT: 37.7 % — ABNORMAL LOW (ref 39.0–52.0)
Hemoglobin: 12.5 g/dL — ABNORMAL LOW (ref 13.0–17.0)
MCH: 30.8 pg (ref 26.0–34.0)
MCHC: 33.2 g/dL (ref 30.0–36.0)
MCV: 92.9 fL (ref 80.0–100.0)
Platelets: 249 10*3/uL (ref 150–400)
RBC: 4.06 MIL/uL — ABNORMAL LOW (ref 4.22–5.81)
RDW: 14 % (ref 11.5–15.5)
WBC: 5.2 10*3/uL (ref 4.0–10.5)
nRBC: 0 % (ref 0.0–0.2)

## 2022-11-21 LAB — TROPONIN I (HIGH SENSITIVITY): Troponin I (High Sensitivity): 3 ng/L (ref <18)

## 2022-11-21 NOTE — ED Triage Notes (Signed)
Patient arrives in wheelchair by POV c/o right sided chest pain onset about 3 hours ago. Describes it as a thumping sensation. Pain does not radiate.reports decreased appetite.

## 2022-11-21 NOTE — ED Notes (Signed)
This RN has noticed that pt has not been brought back to room at this time

## 2022-11-21 NOTE — ED Notes (Signed)
Pt called 3x+ for room and registration. No response.

## 2022-12-12 ENCOUNTER — Other Ambulatory Visit: Payer: Self-pay

## 2022-12-14 ENCOUNTER — Other Ambulatory Visit (HOSPITAL_COMMUNITY): Payer: Self-pay

## 2022-12-14 ENCOUNTER — Other Ambulatory Visit: Payer: Self-pay

## 2022-12-15 ENCOUNTER — Other Ambulatory Visit: Payer: Self-pay

## 2023-01-05 ENCOUNTER — Other Ambulatory Visit: Payer: Self-pay

## 2023-01-16 ENCOUNTER — Ambulatory Visit (HOSPITAL_COMMUNITY): Payer: Commercial Managed Care - HMO | Admitting: Clinical

## 2023-02-01 ENCOUNTER — Other Ambulatory Visit: Payer: Self-pay

## 2023-02-02 ENCOUNTER — Other Ambulatory Visit: Payer: Self-pay

## 2023-02-06 ENCOUNTER — Other Ambulatory Visit: Payer: Self-pay

## 2023-03-05 ENCOUNTER — Ambulatory Visit (HOSPITAL_BASED_OUTPATIENT_CLINIC_OR_DEPARTMENT_OTHER): Payer: Commercial Managed Care - HMO | Admitting: Family

## 2023-03-05 ENCOUNTER — Encounter (HOSPITAL_COMMUNITY): Payer: Self-pay | Admitting: Family

## 2023-03-05 ENCOUNTER — Other Ambulatory Visit: Payer: Self-pay

## 2023-03-05 VITALS — BP 128/84 | HR 66 | Ht 68.0 in | Wt 178.0 lb

## 2023-03-05 DIAGNOSIS — F4320 Adjustment disorder, unspecified: Secondary | ICD-10-CM | POA: Diagnosis not present
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
## 2023-03-06 NOTE — Addendum Note (Signed)
Addended by: Oneta Rack on: 03/06/2023 11:00 AM   Modules accepted: Level of Service

## 2023-03-13 ENCOUNTER — Other Ambulatory Visit: Payer: Self-pay

## 2023-03-13 ENCOUNTER — Encounter: Payer: Self-pay | Admitting: Neurology

## 2023-03-13 ENCOUNTER — Ambulatory Visit (INDEPENDENT_AMBULATORY_CARE_PROVIDER_SITE_OTHER): Payer: Commercial Managed Care - HMO | Admitting: Neurology

## 2023-03-13 VITALS — BP 105/66 | HR 70 | Ht 68.0 in | Wt 178.2 lb

## 2023-03-13 DIAGNOSIS — H7492 Unspecified disorder of left middle ear and mastoid: Secondary | ICD-10-CM | POA: Diagnosis not present

## 2023-03-13 DIAGNOSIS — G40009 Localization-related (focal) (partial) idiopathic epilepsy and epileptic syndromes with seizures of localized onset, not intractable, without status epilepticus: Secondary | ICD-10-CM

## 2023-03-13 DIAGNOSIS — G932 Benign intracranial hypertension: Secondary | ICD-10-CM

## 2023-03-13 MED ORDER — LEVETIRACETAM ER 500 MG PO TB24
1000.0000 mg | ORAL_TABLET | Freq: Two times a day (BID) | ORAL | 11 refills | Status: DC
Start: 2023-03-13 — End: 2023-12-14
  Filled 2023-03-13 – 2023-05-16 (×2): qty 120, 30d supply, fill #0
  Filled 2023-06-27 – 2023-07-13 (×2): qty 120, 30d supply, fill #1
  Filled 2023-09-06: qty 120, 30d supply, fill #2
  Filled 2023-10-23: qty 120, 30d supply, fill #3
  Filled 2023-11-09: qty 120, 30d supply, fill #4

## 2023-03-13 MED ORDER — OXCARBAZEPINE 600 MG PO TABS
1200.0000 mg | ORAL_TABLET | Freq: Two times a day (BID) | ORAL | 11 refills | Status: AC
  Filled 2023-03-13 – 2023-03-26 (×2): qty 120, 30d supply, fill #0
  Filled 2023-05-16: qty 120, 30d supply, fill #1
  Filled 2023-06-27 – 2023-07-13 (×2): qty 120, 30d supply, fill #2
  Filled 2023-09-06: qty 120, 30d supply, fill #3
  Filled 2023-10-23 – 2023-10-25 (×4): qty 120, 30d supply, fill #4
  Filled 2023-10-25: qty 28, 7d supply, fill #4
  Filled 2023-11-09: qty 28, 7d supply, fill #5
  Filled 2023-11-20: qty 60, 15d supply, fill #6
  Filled 2023-12-26: qty 30, 8d supply, fill #7
  Filled 2024-01-01 (×2): qty 120, 30d supply, fill #8
  Filled 2024-02-29: qty 100, 25d supply, fill #9
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
## 2023-03-13 NOTE — Patient Instructions (Signed)
 Good to see you. Wishing you all the best.  Continue Levetiracetam  ER 500mg : take 2 tablets in AM, 2 tablets in PM  2. Continue Oxcarbazepine  600mg : Take 2 tablets in AM, 2 tablets in PM  3. Referral will be sent again for ENT and Ophthalmology. Please ensure you attend their appointments as scheduled  4. Follow-up in 4 months, call for any changes   Seizure Precautions: 1. If medication has been prescribed for you to prevent seizures, take it exactly as directed.  Do not stop taking the medicine without talking to your doctor first, even if you have not had a seizure in a long time.   2. Avoid activities in which a seizure would cause danger to yourself or to others.  Don't operate dangerous machinery, swim alone, or climb in high or dangerous places, such as on ladders, roofs, or girders.  Do not drive unless your doctor says you may.  3. If you have any warning that you may have a seizure, lay down in a safe place where you can't hurt yourself.    4.  No driving for 6 months from last seizure, as per [place]  state law.   Please refer to the following link on the Epilepsy Foundation of America's website for more information: http://www.epilepsyfoundation.org/answerplace/Social/driving/drivingu.cfm   5.  Maintain good sleep hygiene. Avoid alcohol.  6.  Contact your doctor if you have any problems that may be related to the medicine you are taking.  7.  Call 911 and bring the patient back to the ED if:        A.  The seizure lasts longer than 5 minutes.       B.  The patient doesn't awaken shortly after the seizure  C.  The patient has new problems such as difficulty seeing, speaking or moving  D.  The patient was injured during the seizure  E.  The patient has a temperature over 102 F (39C)  F.  The patient vomited and now is having trouble breathing

## 2023-03-22 ENCOUNTER — Other Ambulatory Visit: Payer: Self-pay

## 2023-03-26 ENCOUNTER — Other Ambulatory Visit: Payer: Self-pay

## 2023-03-27 ENCOUNTER — Other Ambulatory Visit: Payer: Self-pay

## 2023-04-12 ENCOUNTER — Other Ambulatory Visit: Payer: Self-pay

## 2023-04-12 ENCOUNTER — Ambulatory Visit (HOSPITAL_COMMUNITY): Admission: EM | Admit: 2023-04-12 | Discharge: 2023-04-12 | Disposition: A | Discharge status: Home / Self Care

## 2023-04-12 ENCOUNTER — Ambulatory Visit (HOSPITAL_COMMUNITY)

## 2023-04-12 ENCOUNTER — Encounter (HOSPITAL_COMMUNITY): Payer: Self-pay

## 2023-04-12 DIAGNOSIS — M79674 Pain in right toe(s): Secondary | ICD-10-CM

## 2023-04-12 DIAGNOSIS — M19079 Primary osteoarthritis, unspecified ankle and foot: Secondary | ICD-10-CM

## 2023-04-12 MED ORDER — DICLOFENAC SODIUM 75 MG PO TBEC
75.0000 mg | DELAYED_RELEASE_TABLET | Freq: Two times a day (BID) | ORAL | 0 refills | Status: AC
Start: 2023-04-12 — End: ?
  Filled 2023-04-12: qty 30, 15d supply, fill #0

## 2023-04-12 NOTE — ED Triage Notes (Signed)
 Patient presenting with right great toe pain and swelling onset this morning. No known injuries.  Prescriptions or OTC medications tried: No

## 2023-04-12 NOTE — Discharge Instructions (Addendum)
  Osteoarthritis great toe metatarsophalangeal joint -X-Andy of the left great toe shows no acute fracture or significant joint dysfunction. -Take prescribed diclofenac 75 mg tablet twice daily as needed for pain and inflammation to left great toe. -Apply ice 2-3 times a day for 10 to 15 minutes at a time directly to left great toe -Minimize weightbearing for the next 3 to 4 days to allow the joint to heal. -If symptoms worsen or new symptoms develop follow-up for further evaluation and management.

## 2023-04-12 NOTE — ED Provider Notes (Signed)
 [facility]  Note:  This document was prepared using Dragon voice recognition software and may include unintentional dictation errors.  MRN: [id number] DOB: 27-Apr-1975  Subjective:   Brian Ward is a 48 y.o. male presenting for right great toe pain x 2 to 3 days.  Patient denies any past injury or known injury or trauma to the toe at this time.  Patient has not taken any over-the-counter medication to treat symptoms.  Patient denies any past history of gout.  Patient reports increased pain with standing and walking.  No current facility-administered medications for this encounter.  Current Outpatient Medications:    diclofenac (VOLTAREN) 75 MG EC tablet, Take 1 tablet (75 mg total) by mouth 2 (two) times daily., Disp: 30 tablet, Rfl: 0   levETIRAcetam (KEPPRA XR) 500 MG 24 hr tablet, Take 2 tablets (1,000 mg total) by mouth 2 (two) times daily., Disp: 120 tablet, Rfl: 11   oxcarbazepine (TRILEPTAL) 600 MG tablet, Take 2 tablets (1,200 mg total) by mouth 2 (two) times daily., Disp: 120 tablet, Rfl: 11   Allergies  Allergen Reactions   Depakote [Divalproex Sodium] Other (See Comments)    The patient said it made him feel uneasy and not like himself   Pork-Derived Products Other (See Comments)     Prefers to not eat pork- "not good for me"   Topiramate     Make him angry,     Past Medical History:  Diagnosis Date   Seizures (HCC)      Past Surgical History:  Procedure Laterality Date   CARDIAC SURGERY     48 yrs old    Family History  Problem Relation Age of Onset   Seizures Maternal Aunt    Sickle cell anemia Maternal Aunt    Diabetes Mother     Social History   Tobacco Use   Smoking status: Every Day    Types: Cigars   Smokeless tobacco: Never   Tobacco comments:    smokes marijuana occasionally cigars every day  Vaping Use   Vaping status: Never Used  Substance Use Topics   Alcohol use: Yes    Comment: occ   Drug use: Not Currently    Types:  Marijuana    ROS Refer to HPI for ROS details.  Objective:   Vitals: BP 136/72 (BP Location: Right Arm)   Pulse 63   Temp 98 F (36.7 C) (Oral)   Resp 16   Ht 5\' 8"  (1.727 m)   Wt 180 lb (81.6 kg)   SpO2 97%   BMI 27.37 kg/m   Physical Exam Vitals and nursing note reviewed.  Constitutional:      General: He is not in acute distress.    Appearance: Normal appearance. He is well-developed and normal weight. He is not ill-appearing or toxic-appearing.  HENT:     Head: Normocephalic.  Cardiovascular:     Rate and Rhythm: Normal rate.  Pulmonary:     Effort: Pulmonary effort is normal. No respiratory distress.  Musculoskeletal:     Left foot: Decreased range of motion.       Feet:  Feet:     Left foot:     Skin integrity: Skin integrity normal.  Skin:    General: Skin is warm and dry.     Capillary Refill: Capillary refill takes less than 2 seconds.  Neurological:     General: No focal deficit present.     Mental Status: He is alert and oriented to person, place,  and time.  Psychiatric:        Mood and Affect: Mood normal.     Procedures  No results found for this or any previous visit (from the past 24 hours).  Assessment and Plan :   PDMP not reviewed this encounter.  1. 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 Great toe pain, Left    Osteoarthritis great toe metatarsophalangeal joint -X-Lybrand of the left great toe shows no acute fracture or significant joint dysfunction. -Take prescribed diclofenac 75 mg tablet twice daily as needed for pain and inflammation to left great toe. -Apply ice 2-3 times a day for 10 to 15 minutes at a time directly to left great toe -Minimize weightbearing for the next 3 to 4 days to allow the joint to heal. -If symptoms worsen or new symptoms develop follow-up for further evaluation and management.  Lucky Cowboy   [place], [place], [place] 04/12/23 [phone]

## 2023-04-30 ENCOUNTER — Encounter (HOSPITAL_COMMUNITY): Payer: Self-pay

## 2023-04-30 ENCOUNTER — Other Ambulatory Visit: Payer: Self-pay

## 2023-04-30 ENCOUNTER — Ambulatory Visit (HOSPITAL_COMMUNITY)
Admission: EM | Admit: 2023-04-30 | Discharge: 2023-04-30 | Disposition: A | Discharge status: Home / Self Care | Attending: Family Medicine | Admitting: Family Medicine

## 2023-04-30 DIAGNOSIS — M79674 Pain in right toe(s): Secondary | ICD-10-CM | POA: Diagnosis not present

## 2023-04-30 DIAGNOSIS — M79604 Pain in right leg: Secondary | ICD-10-CM

## 2023-04-30 MED ORDER — PREDNISONE 20 MG PO TABS
40.0000 mg | ORAL_TABLET | Freq: Every day | ORAL | 0 refills | Status: AC
  Filled 2023-04-30: qty 10, 5d supply, fill #0

## 2023-04-30 MED ORDER — KETOROLAC TROMETHAMINE 30 MG/ML IJ SOLN
INTRAMUSCULAR | Status: AC
  Filled 2023-04-30: qty 1

## 2023-04-30 MED ORDER — KETOROLAC TROMETHAMINE 30 MG/ML IJ SOLN
30.0000 mg | Freq: Once | INTRAMUSCULAR | Status: AC
  Administered 2023-04-30: 30 mg via INTRAMUSCULAR
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
## 2023-04-30 NOTE — ED Triage Notes (Signed)
 Pt presents with right toe pain and swelling that started 04/12/2023. Patient states his right leg is hurting. No known new injuries.

## 2023-04-30 NOTE — Discharge Instructions (Signed)
 You were seen today for toe pain and let pain.  I am treating you today for possible gout given your pain.  I have given you a shot of toradol for pain.  I have sent out a steroid x 5 days.  I do recommend you follow up with podiatry if you continue with pain.  You plan call [facility] at [phone].  Otherwise, please return if you are not improving.

## 2023-05-02 ENCOUNTER — Other Ambulatory Visit: Payer: Self-pay

## 2023-05-16 ENCOUNTER — Other Ambulatory Visit: Payer: Self-pay

## 2023-06-27 ENCOUNTER — Encounter: Payer: Self-pay | Admitting: Neurology

## 2023-06-27 ENCOUNTER — Other Ambulatory Visit: Payer: Self-pay

## 2023-06-27 ENCOUNTER — Other Ambulatory Visit: Payer: MEDICAID

## 2023-06-27 ENCOUNTER — Telehealth: Payer: Self-pay

## 2023-06-27 DIAGNOSIS — G40009 Localization-related (focal) (partial) idiopathic epilepsy and epileptic syndromes with seizures of localized onset, not intractable, without status epilepticus: Secondary | ICD-10-CM

## 2023-06-27 DIAGNOSIS — Z79899 Other long term (current) drug therapy: Secondary | ICD-10-CM

## 2023-06-27 NOTE — Telephone Encounter (Signed)
 Pt was given his letter that stated he may return to work with restrictions of no driving after an episode of loss of awareness/consciousness until 6 months event-free. Pt informed that He needs to take medications as prescribed, discuss alternatives on his follow-up if side effects continue to be an issue. Pt is going to the lab to have blood work drawn ,

## 2023-06-27 NOTE — Telephone Encounter (Signed)
 Pt c/o: seizure Missed medications?  Yes.   He only takes the morning dose he not the night because he says he feels like a zombie, he said maybe once a week he may take the night dose, Sleep deprived?  Yes.    Only sleeps 4 hours a night he said that is his normal  Alcohol intake?  No. Increased stress? No. Any change in medication color or shape? No. Back to their usual baseline self?  Yes.  . If no, advise go to ER Current medications prescribed by Dr. Ty Gales:  oxcarbazepine  (TRILEPTAL ) 600 MG tablet  Take 2 tablets (1,200 mg total) by mouth 2 (two) times daily  levETIRAcetam  (KEPPRA  XR) 500 MG 24 hr tablet Take 2 tablets (1,000 mg total) by mouth 2 (two) times daily.    Pt stated that he DID NOT have a seizure he felt like he was going to have one he felt like his body was locking up and he left work at 10:45 when he got home he went to bed slept until 10pm. He said he loves his job at [employer] and that they are going to hire him from the [employer] full time. He keeps saying over and over it was not a Seizure. He said his last seizure was September of 2024 and that this did not fell like that. Pt was advised that he dose not need to make his own changes to medication with not talking to Dr Ty Gales,

## 2023-06-27 NOTE — Telephone Encounter (Signed)
 Letter written that he may return to work with restrictions of no driving after an episode of loss of awareness/consciousness until 6 months event-free. He needs to take medications as prescribed, discuss alternatives on his follow-up if side effects continue to be an issue.

## 2023-07-04 LAB — LEVETIRACETAM LEVEL: Keppra (Levetiracetam): 11.1 ug/mL — ABNORMAL LOW

## 2023-07-04 LAB — OXCARBAZEPINE (TRILEPTAL), SERUM: Oxcarbazepine Metabolite: 20.5 ug/mL (ref 8.0–35.0)

## 2023-07-09 ENCOUNTER — Other Ambulatory Visit: Payer: Self-pay

## 2023-07-09 ENCOUNTER — Ambulatory Visit: Payer: MEDICAID | Admitting: Neurology

## 2023-07-09 ENCOUNTER — Encounter: Payer: Self-pay | Admitting: Neurology

## 2023-07-13 ENCOUNTER — Other Ambulatory Visit: Payer: Self-pay

## 2023-08-13 ENCOUNTER — Encounter (HOSPITAL_COMMUNITY): Payer: Self-pay

## 2023-08-13 ENCOUNTER — Emergency Department (HOSPITAL_COMMUNITY)
Admission: EM | Admit: 2023-08-13 | Discharge: 2023-08-13 | Disposition: A | Discharge status: Home / Self Care | Payer: MEDICAID | Attending: Emergency Medicine | Admitting: Emergency Medicine

## 2023-08-13 ENCOUNTER — Other Ambulatory Visit: Payer: Self-pay

## 2023-08-13 ENCOUNTER — Emergency Department (HOSPITAL_COMMUNITY): Payer: MEDICAID

## 2023-08-13 DIAGNOSIS — R569 Unspecified convulsions: Secondary | ICD-10-CM | POA: Insufficient documentation

## 2023-08-13 LAB — CBC WITH DIFFERENTIAL/PLATELET
Abs Immature Granulocytes: 0.02 K/uL (ref 0.00–0.07)
Basophils Absolute: 0 K/uL (ref 0.0–0.1)
Basophils Relative: 0 %
Eosinophils Absolute: 0.1 K/uL (ref 0.0–0.5)
Eosinophils Relative: 2 %
HCT: 37.6 % — ABNORMAL LOW (ref 39.0–52.0)
Hemoglobin: 12.4 g/dL — ABNORMAL LOW (ref 13.0–17.0)
Immature Granulocytes: 0 %
Lymphocytes Relative: 22 %
Lymphs Abs: 1.5 K/uL (ref 0.7–4.0)
MCH: 30.9 pg (ref 26.0–34.0)
MCHC: 33 g/dL (ref 30.0–36.0)
MCV: 93.8 fL (ref 80.0–100.0)
Monocytes Absolute: 0.6 K/uL (ref 0.1–1.0)
Monocytes Relative: 9 %
Neutro Abs: 4.5 K/uL (ref 1.7–7.7)
Neutrophils Relative %: 67 %
Platelets: 207 K/uL (ref 150–400)
RBC: 4.01 MIL/uL — ABNORMAL LOW (ref 4.22–5.81)
RDW: 14.7 % (ref 11.5–15.5)
WBC: 6.8 K/uL (ref 4.0–10.5)
nRBC: 0 % (ref 0.0–0.2)

## 2023-08-13 LAB — BASIC METABOLIC PANEL WITH GFR
Anion gap: 10 (ref 5–15)
BUN: 6 mg/dL (ref 6–20)
CO2: 25 mmol/L (ref 22–32)
Calcium: 9.2 mg/dL (ref 8.9–10.3)
Chloride: 104 mmol/L (ref 98–111)
Creatinine, Ser: 0.51 mg/dL — ABNORMAL LOW (ref 0.61–1.24)
GFR, Estimated: >60 mL/min (ref >60)
Glucose, Bld: 94 mg/dL (ref 70–99)
Potassium: 4.2 mmol/L (ref 3.5–5.1)
Sodium: 139 mmol/L (ref 135–145)

## 2023-08-13 MED ORDER — OXCARBAZEPINE 300 MG PO TABS
1200.0000 mg | ORAL_TABLET | ORAL | Status: AC
  Administered 2023-08-13: 1200 mg via ORAL
  Filled 2023-08-13: qty 4

## 2023-08-13 MED ORDER — LEVETIRACETAM 500 MG PO TABS
1000.0000 mg | ORAL_TABLET | Freq: Once | ORAL | Status: AC
  Administered 2023-08-13: 1000 mg via ORAL
  Filled 2023-08-13: qty 2

## 2023-08-13 MED ORDER — LEVETIRACETAM (KEPPRA) 500 MG/5 ML ADULT IV PUSH: 4500.0000 mg | Freq: Once | INTRAVENOUS | Status: DC

## 2023-08-13 NOTE — ED Provider Notes (Signed)
 [place] EMERGENCY DEPARTMENT AT [facility] Provider Note   CSN: [id number] Arrival date & time: 08/13/23  9461     Patient presents with: Seizures   Brian Ward is a 48 y.o. male.   HPI 48 year old male history of seizure disorder presents today with 2 seizures.  His girlfriend was with him in bed when she noticed him having general grand mal seizures.  They lasted several minutes.  He is currently back to baseline.  He reports taking his medications as prescribed.  He denies any fever, chills, headache, head injury, neck pain, chest pain, shortness of breath, abdominal pain, nausea, vomiting, diarrhea    Prior to Admission medications   Medication Sig Start Date End Date Taking? Authorizing Provider  diclofenac  (VOLTAREN ) 75 MG EC tablet Take 1 tablet (75 mg total) by mouth 2 (two) times daily. 04/12/23   Reddick, Johnathan B, NP  levETIRAcetam  (KEPPRA  XR) 500 MG 24 hr tablet Take 2 tablets (1,000 mg total) by mouth 2 (two) times daily. 03/13/23   Georjean Darice HERO, MD  oxcarbazepine  (TRILEPTAL ) 600 MG tablet Take 2 tablets (1,200 mg total) by mouth 2 (two) times daily. 03/13/23   Georjean Darice HERO, MD    Allergies: Depakote  [divalproex  sodium], Pork-derived products, and Topiramate     Review of Systems  Updated Vital Signs BP 105/66 (BP Location: Right Arm)   Pulse 70   Temp 98.4 F (36.9 C) (Oral)   Resp 14   Ht 1.727 m (5' 8)   Wt 82.6 kg   SpO2 100%   BMI 27.67 kg/m   Physical Exam Vitals and nursing note reviewed.  Constitutional:      Appearance: Normal appearance. He is well-developed.  HENT:     Head: Normocephalic and atraumatic.     Right Ear: External ear normal.     Left Ear: External ear normal.     Nose: Nose normal.  Eyes:     Extraocular Movements: Extraocular movements intact.  Neck:     Trachea: No tracheal deviation.  Pulmonary:     Effort: Pulmonary effort is normal.  Musculoskeletal:        General: Normal range of motion.  Skin:     General: Skin is warm and dry.  Neurological:     Mental Status: He is alert and oriented to person, place, and time.  Psychiatric:        Mood and Affect: Mood normal.        Behavior: Behavior normal.     (all labs ordered are listed, but only abnormal results are displayed) Labs Reviewed  CBC WITH DIFFERENTIAL/PLATELET - Abnormal; Notable for the following components:      Result Value   RBC 4.01 (*)    Hemoglobin 12.4 (*)    HCT 37.6 (*)    All other components within normal limits  BASIC METABOLIC PANEL WITH GFR - Abnormal; Notable for the following components:   Creatinine, Ser 0.51 (*)    All other components within normal limits  RAPID URINE DRUG SCREEN, HOSP PERFORMED    EKG: None  Radiology: CT Head Wo Contrast Result Date: 08/13/2023 CLINICAL DATA:  Seizure. EXAM: CT HEAD WITHOUT CONTRAST TECHNIQUE: Contiguous axial images were obtained from the base of the skull through the vertex without intravenous contrast. RADIATION DOSE REDUCTION: This exam was performed according to the departmental dose-optimization program which includes automated exposure control, adjustment of the mA and/or kV according to patient size and/or use of iterative reconstruction technique. COMPARISON:  06/26/2022 FINDINGS: Brain: There is no evidence for acute hemorrhage, hydrocephalus, mass lesion, or abnormal extra-axial fluid collection. No definite CT evidence for acute infarction. Vascular: No hyperdense vessel or unexpected calcification. Skull: No evidence for fracture. No worrisome lytic or sclerotic lesion. Sinuses/Orbits: Left mastoid effusion noted, stable. Remaining visualized paranasal sinuses are clear. Visualized portions of the globes and intraorbital fat are unremarkable. Other: None. IMPRESSION: 1. No acute intracranial abnormality. 2. Stable left mastoid effusion. Electronically Signed   By: Camellia Candle M.D.   On: 08/13/2023 07:22     .Critical Care  Performed by: Levander Houston,  MD Authorized by: Levander Houston, MD   Critical care provider statement:    Critical care time (minutes):  30   Critical care end time:  08/13/2023 9:33 AM   Critical care time was exclusive of:  Separately billable procedures and treating other patients and teaching time   Critical care was necessary to treat or prevent imminent or life-threatening deterioration of the following conditions:  CNS failure or compromise   Critical care was time spent personally by me on the following activities:  Development of treatment plan with patient or surrogate, discussions with consultants, evaluation of patient's response to treatment, examination of patient, ordering and review of laboratory studies, ordering and review of radiographic studies, ordering and performing treatments and interventions, pulse oximetry, re-evaluation of patient's condition and review of old charts    Medications Ordered in the ED  Oxcarbazepine  (TRILEPTAL ) tablet 1,200 mg (1,200 mg Oral Given 08/13/23 0722)  levETIRAcetam  (KEPPRA ) tablet 1,000 mg (1,000 mg Oral Given 08/13/23 0722)    Clinical Course as of 08/13/23 0933  Mon Aug 13, 2023  0825 CT head shows no acute abnormalities [DR]  0930 CBC reviewed interpreted within normal limits  [DR]  0931 Basic metabolic panel reviewed interpreted and within normal limits [DR]    Clinical Course User Index [DR] Levander Houston, MD                                 Medical Decision Making Risk Prescription drug management.   48 year old man with known seizure disorder who had seizure this morning.  Based on history it does appear that this was a grand mal seizure.  He has had no symptoms since then and is stable here in the ED. Patient reports taking medications as prescribed, he reports no substance use, he reports decreased sleep but stable for several years, no other definite triggers found.  Patient evaluated here for electrolyte abnormalities or other etiologies and does not  appear to have any other etiologies.  He is given his medications here in the ED.  He has had a head CT and labs.  He appears to be stable for discharge.  He is advised regarding seizure precautions, taking his medications and close follow-up with his neurologist.     Final diagnoses:  Seizure [facility])    ED Discharge Orders     None          Levander Houston, MD 08/13/23 [phone]

## 2023-08-13 NOTE — ED Provider Triage Note (Signed)
 Emergency Medicine Provider Triage Evaluation Note  Brian Ward , a 48 y.o. male  was evaluated in triage.  Pt complains of seizures   Review of Systems  Positive: Seizure  Negative: fever  Physical Exam  BP 116/61   Pulse 82   Temp 98.2 F (36.8 C) (Oral)   Resp 18   Ht 5' 8 (1.727 m)   Wt 82.6 kg   SpO2 97%   BMI 27.67 kg/m  Gen:   Awake, no distress   Resp:  Normal effort  MSK:   Moves extremities without difficulty  Other:    Medical Decision Making  Medically screening exam initiated at 6:27 AM.  Appropriate orders placed.  Maria Gallicchio was informed that the remainder of the evaluation will be completed by another provider, this initial triage assessment does not replace that evaluation, and the importance of remaining in the ED until their evaluation is complete.     Evamarie Raetz, MD 08/13/23 [phone]

## 2023-08-13 NOTE — Discharge Instructions (Addendum)
 You were given your morning medications for seizures here in the ED.  No new source of seizures was found with normal electrolytes and head CT here in the ED. Please contact your neurologist for close follow-up.  You must be on seizure precautions until cleared by neurology. Per [place]  DMV statutes, patients with seizures are not allowed to drive until they have been seizure-free for six months. Use caution when using heavy equipment or power tools. Avoid working on ladders or at heights. Take showers instead of baths. Ensure the water temperature is not too high on the home water heater. Do not go swimming alone. Do not lock yourself in a room alone (i.e. bathroom). When caring for infants or small children, sit down when holding, feeding, or changing them to minimize risk of injury to the child in the event you have a seizure. Maintain good sleep hygiene. Avoid alcohol.    If Brian Ward has another seizure, call 911 and bring them back to the ED if:       A.  The seizure lasts longer than 5 minutes.            B.  The patient doesn't wake shortly after the seizure or has new problems such as difficulty seeing, speaking or moving following the seizure       C.  The patient was injured during the seizure       D.  The patient has a temperature over 102 F (39C)       E.  The patient vomited during the seizure and now is having trouble breathing

## 2023-08-13 NOTE — ED Triage Notes (Signed)
 Pt from home via EMS for seizure.  Wife states pt woke her out of sleep due to seizure which lasted 2-3 minutes.  Pt then had another seizure lasting approximately 2 minutes.  Pt is alert and oriented at this time.

## 2023-08-13 NOTE — ED Notes (Signed)
 This RN attempted to place IV one time without success.  RN told pt that this RN would go get an ultrasound machine to assist in placement.  Pt wife refused to allow this RN to do that.  This RN spoke with another RN who agreed to attempt IV.  Pt and pt wife refused IV at that time.    MD notified.  IV team consult placed.

## 2023-09-06 ENCOUNTER — Other Ambulatory Visit: Payer: Self-pay

## 2023-09-18 ENCOUNTER — Telehealth: Payer: Self-pay | Admitting: Neurology

## 2023-09-18 NOTE — Telephone Encounter (Signed)
 Pt is having issues finding Mental health providers with insurance, explained he would need to contact insurer to find covered providers, but said he has explained this to Dr. Georjean and needs help finding a covered mental health provider.

## 2023-10-04 ENCOUNTER — Telehealth: Payer: Self-pay | Admitting: Neurology

## 2023-10-04 NOTE — Telephone Encounter (Signed)
 Per patient he needs a letter stating that the patient DX and he can not work.    Patient advised the front desk was just going over with him the office policy for all patient's and that it may take up to that long for any paperwork.   But once completed we will let him know.

## 2023-10-04 NOTE — Telephone Encounter (Signed)
 Pt called wanting to know his last appt with aquino, I told him feb 2025. He states he came to his no show appt and was seen, which was 07/2023. He said he needs paperwork filled out for his lawyer so he can try to get disability. He asked when aquinos appt is next, I told him march 2026 and he would be on wait list. He said he couldn't wait and that he needed forms signed. I told him it could possibly be a 25.00 fee and that it could be up to 2 weeks for aquino to fill forms out. He asked if I was sure cause he has done this before. He asked why can't he just come in the office and let aquino sign them. I told him that was not the process. He asked if I was sure again.I said yes, he said well I will come to the office next week with forms and talk with her.

## 2023-10-05 NOTE — Telephone Encounter (Signed)
 Did he drop off paperwork? If it needs a letter, we can have that for him on Monday. If there is a form, he has to drop it off but I cannot fill it out the same day. Thanks

## 2023-10-05 NOTE — Telephone Encounter (Signed)
 LMOVM with Dr.Aqunio note below.  Letter is needed like the last time.

## 2023-10-09 ENCOUNTER — Encounter: Payer: Self-pay | Admitting: Neurology

## 2023-10-09 NOTE — Telephone Encounter (Signed)
Letter done, thanks.

## 2023-10-23 ENCOUNTER — Other Ambulatory Visit: Payer: Self-pay

## 2023-10-23 NOTE — Telephone Encounter (Signed)
 Pt. Dropped off paper work, no fee was taken yet (patient was advised . Was confused as it seems this was done previously. Put paperwork inbox

## 2023-10-24 ENCOUNTER — Other Ambulatory Visit: Payer: Self-pay

## 2023-10-25 ENCOUNTER — Other Ambulatory Visit: Payer: Self-pay

## 2023-10-25 ENCOUNTER — Other Ambulatory Visit: Payer: Self-pay | Admitting: Neurology

## 2023-10-25 DIAGNOSIS — G40009 Localization-related (focal) (partial) idiopathic epilepsy and epileptic syndromes with seizures of localized onset, not intractable, without status epilepticus: Secondary | ICD-10-CM

## 2023-10-25 NOTE — Telephone Encounter (Signed)
**Note De-identified  [place] Obfuscation** Please advise 

## 2023-10-26 ENCOUNTER — Other Ambulatory Visit: Payer: Self-pay

## 2023-10-26 ENCOUNTER — Telehealth: Payer: Self-pay | Admitting: Neurology

## 2023-10-26 NOTE — Telephone Encounter (Signed)
 Done. Pls let him know for future paperwork that our office has a 7-10 day turnaround. He does not need to come to the office, we will call him when it is done. Thanks

## 2023-10-26 NOTE — Telephone Encounter (Signed)
 Pt called and LM- he gets to see the judge Tuesday- he needs the paper work for the judge/lawyer. He dropped paperwork off

## 2023-10-26 NOTE — Telephone Encounter (Signed)
 Pt form is completed/signed placed at the front office.

## 2023-10-26 NOTE — Telephone Encounter (Signed)
 Form (attorney lewis-keller)form is completed/signed and placed at the front office.

## 2023-10-29 ENCOUNTER — Other Ambulatory Visit: Payer: Self-pay

## 2023-10-29 DIAGNOSIS — Z0279 Encounter for issue of other medical certificate: Secondary | ICD-10-CM

## 2023-11-09 ENCOUNTER — Other Ambulatory Visit (HOSPITAL_COMMUNITY): Payer: Self-pay

## 2023-11-09 ENCOUNTER — Other Ambulatory Visit: Payer: Self-pay

## 2023-11-14 ENCOUNTER — Telehealth: Payer: Self-pay | Admitting: Neurology

## 2023-11-14 NOTE — Telephone Encounter (Signed)
 Faxed letter --[phone]

## 2023-11-14 NOTE — Telephone Encounter (Signed)
 Pt called and LM with AN. He is at [facility] and he needs paperwork stating he is unfit to work faxed over so he can receive assistance.  Fax [phone]

## 2023-11-20 ENCOUNTER — Other Ambulatory Visit: Payer: Self-pay

## 2023-12-13 ENCOUNTER — Emergency Department (HOSPITAL_COMMUNITY): Payer: Self-pay

## 2023-12-13 ENCOUNTER — Encounter (HOSPITAL_COMMUNITY): Payer: Self-pay | Admitting: Emergency Medicine

## 2023-12-13 ENCOUNTER — Emergency Department (HOSPITAL_COMMUNITY)
Admission: EM | Admit: 2023-12-13 | Discharge: 2023-12-14 | Disposition: A | Discharge status: Home / Self Care | Payer: Self-pay | Attending: Emergency Medicine | Admitting: Emergency Medicine

## 2023-12-13 DIAGNOSIS — R569 Unspecified convulsions: Secondary | ICD-10-CM | POA: Insufficient documentation

## 2023-12-13 LAB — RAPID URINE DRUG SCREEN, HOSP PERFORMED
Amphetamines: NOT DETECTED
Barbiturates: NOT DETECTED
Benzodiazepines: NOT DETECTED
Cocaine: NOT DETECTED
Opiates: POSITIVE — AB
Tetrahydrocannabinol: POSITIVE — AB

## 2023-12-13 LAB — CBC WITH DIFFERENTIAL/PLATELET
Abs Immature Granulocytes: 0.03 K/uL (ref 0.00–0.07)
Basophils Absolute: 0 K/uL (ref 0.0–0.1)
Basophils Relative: 0 %
Eosinophils Absolute: 0.1 K/uL (ref 0.0–0.5)
Eosinophils Relative: 1 %
HCT: 37.1 % — ABNORMAL LOW (ref 39.0–52.0)
Hemoglobin: 12.3 g/dL — ABNORMAL LOW (ref 13.0–17.0)
Immature Granulocytes: 0 %
Lymphocytes Relative: 22 %
Lymphs Abs: 1.6 K/uL (ref 0.7–4.0)
MCH: 31.1 pg (ref 26.0–34.0)
MCHC: 33.2 g/dL (ref 30.0–36.0)
MCV: 93.9 fL (ref 80.0–100.0)
Monocytes Absolute: 0.9 K/uL (ref 0.1–1.0)
Monocytes Relative: 12 %
Neutro Abs: 4.7 K/uL (ref 1.7–7.7)
Neutrophils Relative %: 65 %
Platelets: 274 K/uL (ref 150–400)
RBC: 3.95 MIL/uL — ABNORMAL LOW (ref 4.22–5.81)
RDW: 14.5 % (ref 11.5–15.5)
WBC: 7.3 K/uL (ref 4.0–10.5)
nRBC: 0 % (ref 0.0–0.2)

## 2023-12-13 LAB — COMPREHENSIVE METABOLIC PANEL WITH GFR
ALT: 10 U/L (ref 0–44)
AST: 18 U/L (ref 15–41)
Albumin: 4 g/dL (ref 3.5–5.0)
Alkaline Phosphatase: 89 U/L (ref 38–126)
Anion gap: 12 (ref 5–15)
BUN: 8 mg/dL (ref 6–20)
CO2: 21 mmol/L — ABNORMAL LOW (ref 22–32)
Calcium: 8.7 mg/dL — ABNORMAL LOW (ref 8.9–10.3)
Chloride: 102 mmol/L (ref 98–111)
Creatinine, Ser: 1.07 mg/dL (ref 0.61–1.24)
GFR, Estimated: >60 mL/min (ref >60)
Glucose, Bld: 153 mg/dL — ABNORMAL HIGH (ref 70–99)
Potassium: 4.5 mmol/L (ref 3.5–5.1)
Sodium: 135 mmol/L (ref 135–145)
Total Bilirubin: 0.6 mg/dL (ref 0.0–1.2)
Total Protein: 6.7 g/dL (ref 6.5–8.1)

## 2023-12-13 LAB — ETHANOL: Alcohol, Ethyl (B): <15 mg/dL (ref <15)

## 2023-12-13 LAB — CBG MONITORING, ED: Glucose-Capillary: 149 mg/dL — ABNORMAL HIGH (ref 70–99)

## 2023-12-13 LAB — MAGNESIUM: Magnesium: 2.3 mg/dL (ref 1.7–2.4)

## 2023-12-13 NOTE — ED Triage Notes (Signed)
 PT from home for reported seizure. Reported he had unwitnessed one at 1500. Fell and hit jaw on floor during. Second one 1 hour ago lasting 5 minutes tonic clonic in nature. EMS arrived and noticed he seemed post ictal and uncooperative. No urinary incontinence.  States he takes Keppra .

## 2023-12-13 NOTE — ED Provider Notes (Signed)
 [facility] EMERGENCY DEPARTMENT AT [facility] Provider Note   CSN: [id number] Arrival date & time: 12/13/23  2133     Patient presents with: Seizures   Brian Ward is a 48 y.o. 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  Patient provides the entire history to my arrival to bedside, he is alert and oriented x 4, GCS of 15.  He reports that he was vacuuming this evening whenever he suddenly woke up on the ground, soreness to his right jaw which he believes he struck on the side of the couch after he fell, he felt postictal less he typically does.  Later in the evening his girlfriend witnessed what she describes as a cluster of seizures; multiple seizures separated by 10 to 30 seconds each lasting for a total of approximately 10 minutes.  Patient states that he had both doses of his medication today and has not been missing any doses for the last several weeks.  Last breakthrough seizure was in July of this year.  Follows with [facility] neurology.  Denies any new medications or exposure to illicit substances.  He does endorse that his jaw was sore after his suspected seizure this afternoon that was unwitnessed therefore he did not want his girlfriend's Norco to help with his sore jaw.  Additionally uses THC regularly.   HPI     Prior to Admission medications   Medication Sig Start Date End Date Taking? Authorizing Provider  diclofenac  (VOLTAREN ) 75 MG EC tablet Take 1 tablet (75 mg total) by mouth 2 (two) times daily. 04/12/23   Reddick, Johnathan B, NP  levETIRAcetam  (KEPPRA  XR) 500 MG 24 hr tablet Take 2 tablets (1,000 mg total) by mouth 2 (two) times daily. 03/13/23   Georjean Darice HERO, MD  oxcarbazepine  (TRILEPTAL ) 600 MG tablet Take 2 tablets (1,200 mg total) by mouth 2 (two) times daily. 03/13/23   Georjean Darice HERO, MD    Allergies: Depakote  [divalproex  sodium], Porcine (pork) protein-containing  drug products, and Topiramate     Review of Systems  Neurological:  Positive for seizures.    Updated Vital Signs BP 109/60 (BP Location: Right Arm)   Pulse 82   Temp 98.4 F (36.9 C) (Oral)   Resp 16   SpO2 95%   Physical Exam Vitals and nursing note reviewed.  Constitutional:      Appearance: He is not ill-appearing or toxic-appearing.  HENT:     Head: Normocephalic and atraumatic.      Mouth/Throat:     Mouth: Mucous membranes are moist.     Dentition: Abnormal dentition. Dental tenderness and dental caries present.     Pharynx: No oropharyngeal exudate or posterior oropharyngeal erythema.     Comments: Very poor dentition, fractured molars in the left mandible, per patient unclear if new fractures today or pre-existing. Eyes:     General: Lids are normal. Vision grossly intact.        Right eye: No discharge.        Left eye: No discharge.     Conjunctiva/sclera: Conjunctivae normal.     Pupils: Pupils are equal, round, and reactive to light.  Neck:     Trachea: Trachea and phonation normal.  Cardiovascular:     Rate and Rhythm: Normal rate and regular rhythm.     Pulses: Normal pulses.     Heart sounds: Normal heart sounds. No murmur heard. Pulmonary:     Effort: Pulmonary  effort is normal. No respiratory distress.     Breath sounds: Normal breath sounds. No wheezing or rales.  Abdominal:     General: Bowel sounds are normal. There is no distension.     Palpations: Abdomen is soft.     Tenderness: There is no abdominal tenderness.  Musculoskeletal:        General: No deformity.     Cervical back: Normal range of motion and neck supple.  Lymphadenopathy:     Cervical: No cervical adenopathy.  Skin:    General: Skin is warm and dry.     Capillary Refill: Capillary refill takes less than 2 seconds.  Neurological:     General: No focal deficit present.     Mental Status: He is alert and oriented to person, place, and time. Mental status is at baseline.     GCS:  GCS eye subscore is 4. GCS verbal subscore is 5. GCS motor subscore is 6.     Cranial Nerves: Cranial nerves 2-12 are intact.     Sensory: Sensation is intact.     Motor: Motor function is intact.     Coordination: Coordination is intact.     Gait: Gait is intact.  Psychiatric:        Mood and Affect: Mood normal.     (all labs ordered are listed, but only abnormal results are displayed) Labs Reviewed  CBG MONITORING, ED - Abnormal; Notable for the following components:      Result Value   Glucose-Capillary 149 (*)    All other components within normal limits  COMPREHENSIVE METABOLIC PANEL WITH GFR  CBC WITH DIFFERENTIAL/PLATELET  RAPID URINE DRUG SCREEN, HOSP PERFORMED  LEVETIRACETAM  LEVEL    EKG: None  Radiology: No results found.   Procedures   Medications Ordered in the ED - No data to display  Clinical Course as of 12/14/23 0651  [facility] Dec 14, 2023  0138 Consult to Dr. Vanessa, neurologist, who recommends obs until at least 7 am given question of clustering witnessed by patient's partner. Recommends 1500mg  keppra  in the ED tonight, and Increase OP keppra  to 1500mg  BID. Keep trileptal  dosing stable. I appreciate his collaboration in the care of this patient.  [RS]    Clinical Course User Index [RS] Keali Mccraw, Pleasant SAUNDERS, PA-C                                 Medical Decision Making 48 year old male with history of epilepsy with 2 breakthrough seizures today despite reported compliance with his antiepileptic medications.  Workup today is reassuring, vitals are normal, neurologic exam is nonfocal.   Amount and/or Complexity of Data Reviewed Labs: ordered.    Details:  CBC with mild anemia with hemoglobin of 12.3 near baseline, CMP unremarkable, mag is normal, UDS positive for reported THC and opiates consistent with patient's history.  Keppra  and trach lepto levels pending.     Radiology: ordered.    Details: Chest x-Juul negative, CTs of the head and face are  negative as well.  Risk Prescription drug management.   Case was discussed with the neurologist as above who recommends increasing Keppra  to 1500 mg twice daily, 1 dose of Keppra  IV now and continuing Trileptal  at home.  He recommends observing overnight, if seizure-free may be discharged home.  Patient mains seizure-free throughout his stay in the emergency department and is nonfocal at this time with normal neurologic exam and GCS.  Will prescribe increased dose of Keppra  to his preferred pharmacy and have him call follow-up closely with [facility] neurology outpatient with whom he is established.  Clinical concern for emergent underlying condition that warrant further ED workup and patient management is exceedingly low.  Elya  voiced understanding of his medical evaluation and treatment plan. Each of their questions answered to their expressed satisfaction.  Return precautions were given.  Patient is well-appearing, stable, and was discharged in good condition.  This chart was dictated using voice recognition software, Dragon. Despite the best efforts of this provider to proofread and correct errors, errors may still occur which can change documentation meaning.      Final diagnoses:  None    ED Discharge Orders     None          Bobette Pleasant JONELLE DEVONNA 12/14/23 9343    Yolande Lamar BROCKS, MD 12/18/23 2351

## 2023-12-14 ENCOUNTER — Other Ambulatory Visit: Payer: Self-pay

## 2023-12-14 ENCOUNTER — Emergency Department (HOSPITAL_COMMUNITY): Payer: Self-pay

## 2023-12-14 MED ORDER — OXCARBAZEPINE 300 MG PO TABS: 600.0000 mg | ORAL_TABLET | Freq: Once | ORAL | Status: DC

## 2023-12-14 MED ORDER — LEVETIRACETAM (KEPPRA) 500 MG/5 ML ADULT IV PUSH
1500.0000 mg | Freq: Once | INTRAVENOUS | Status: AC
  Administered 2023-12-14: 1500 mg via INTRAVENOUS
  Filled 2023-12-14: qty 15

## 2023-12-14 MED ORDER — KETOROLAC TROMETHAMINE 15 MG/ML IJ SOLN
15.0000 mg | Freq: Once | INTRAMUSCULAR | Status: AC
  Administered 2023-12-14: 15 mg via INTRAVENOUS
  Filled 2023-12-14: qty 1

## 2023-12-14 MED ORDER — LEVETIRACETAM 750 MG PO TABS
1500.0000 mg | ORAL_TABLET | Freq: Two times a day (BID) | ORAL | 1 refills | Status: AC
  Filled 2023-12-14 – 2023-12-21 (×2): qty 8, 2d supply, fill #0
  Filled 2024-01-01 (×2): qty 120, 30d supply, fill #1

## 2023-12-14 NOTE — Discharge Instructions (Addendum)
 You were seen here today for your seizures.  Please send a new prescription for your Keppra  which you should increase to 1500 mg twice per day.  Please continue your Trileptal  as previously prescribed.  Increase your hydration, please be sure to be eating, follow-up with a dentist regarding your dental concerns and return to the ER with any new severe symptoms.  See below procedure precautions.   Seizure precautions: Per [place]  DMV statutes, patients with seizures are not allowed to drive until they have been seizure-free for six months and cleared by a physician    Use caution when using heavy equipment or power tools. Avoid working on ladders or at heights. Take showers instead of baths. Ensure the water temperature is not too high on the home water heater. Do not go swimming alone. Do not lock yourself in a room alone (i.e. bathroom). When caring for infants or small children, sit down when holding, feeding, or changing them to minimize risk of injury to the child in the event you have a seizure. Maintain good sleep hygiene. Avoid alcohol.    If patient has another seizure, call 911 and bring them back to the ED if: A.  The seizure lasts longer than 5 minutes.      B.  The patient doesn't wake shortly after the seizure or has new problems such as difficulty seeing, speaking or moving following the seizure C.  The patient was injured during the seizure D.  The patient has a temperature over 102 F (39C) E.  The patient vomited during the seizure and now is having trouble breathing    During the Seizure   - First, ensure adequate ventilation and place patients on the floor on their left side  Loosen clothing around the neck and ensure the airway is patent. If the patient is clenching the teeth, do not force the mouth open with any object as this can cause severe damage - Remove all items from the surrounding that can be hazardous. The patient may be oblivious to what's happening and may  not even know what he or she is doing. If the patient is confused and wandering, either gently guide him/her away and block access to outside areas - Reassure the individual and be comforting - Call 911. In most cases, the seizure ends before EMS arrives. However, there are cases when seizures may last over 3 to 5 minutes. Or the individual may have developed breathing difficulties or severe injuries. If a pregnant patient or a person with diabetes develops a seizure, it is prudent to call an ambulance. - Finally, if the patient does not regain full consciousness, then call EMS. Most patients will remain confused for about 45 to 90 minutes after a seizure, so you must use judgment in calling for help. - Avoid restraints but make sure the patient is in a bed with padded side rails - Place the individual in a lateral position with the neck slightly flexed; this will help the saliva drain from the mouth and prevent the tongue from falling backward - Remove all nearby furniture and other hazards from the area - Provide verbal assurance as the individual is regaining consciousness - Provide the patient with privacy if possible - Call for help and start treatment as ordered by the caregiver    After the Seizure (Postictal Stage)   After a seizure, most patients experience confusion, fatigue, muscle pain and/or a headache. Thus, one should permit the individual to sleep. For the next  few days, reassurance is essential. Being calm and helping reorient the person is also of importance.   Most seizures are painless and end spontaneously. Seizures are not harmful to others but can lead to complications such as stress on the lungs, brain and the heart. Individuals with prior lung problems may develop labored breathing and respiratory distress.

## 2023-12-14 NOTE — Plan of Care (Signed)
 Brief Neuro Note:  Hx of seizures on Keppra  and Oxcarbazepine  and reports compliance since July of this year. He comes in with multiple breakthrough seizures at home. EMS gave him benzos. He is back to his baseline now. Denies missing any doses to the EDP.  No obvious electrolyte abnormalities noted.  Plan: - increase Keppra  to 1500mg  BID. Give 1 dose right now IV. Continue Oxcarbazepine  at 1200mg  BID. - Keppra  and Oxcarbazepine  levels ordered - observe obernight to monitor for seizure clustering. - no driving for 6 months. Has to be seizure free before he can resume driving. - full seizure precautions listed below and also pasted in the discharge instructions.  Darril Patriarca [facility]     _________________________________________________________________________________   Seizure precautions: Per [place]  DMV statutes, patients with seizures are not allowed to drive until they have been seizure-free for six months and cleared by a physician    Use caution when using heavy equipment or power tools. Avoid working on ladders or at heights. Take showers instead of baths. Ensure the water temperature is not too high on the home water heater. Do not go swimming alone. Do not lock yourself in a room alone (i.e. bathroom). When caring for infants or small children, sit down when holding, feeding, or changing them to minimize risk of injury to the child in the event you have a seizure. Maintain good sleep hygiene. Avoid alcohol.    If patient has another seizure, call 911 and bring them back to the ED if: A.  The seizure lasts longer than 5 minutes.      B.  The patient doesn't wake shortly after the seizure or has new problems such as difficulty seeing, speaking or moving following the seizure C.  The patient was injured during the seizure D.  The patient has a temperature over 102 F (39C) E.  The patient vomited during the seizure and now is having trouble breathing     During the Seizure   - First, ensure adequate ventilation and place patients on the floor on their left side  Loosen clothing around the neck and ensure the airway is patent. If the patient is clenching the teeth, do not force the mouth open with any object as this can cause severe damage - Remove all items from the surrounding that can be hazardous. The patient may be oblivious to what's happening and may not even know what he or she is doing. If the patient is confused and wandering, either gently guide him/her away and block access to outside areas - Reassure the individual and be comforting - Call 911. In most cases, the seizure ends before EMS arrives. However, there are cases when seizures may last over 3 to 5 minutes. Or the individual may have developed breathing difficulties or severe injuries. If a pregnant patient or a person with diabetes develops a seizure, it is prudent to call an ambulance. - Finally, if the patient does not regain full consciousness, then call EMS. Most patients will remain confused for about 45 to 90 minutes after a seizure, so you must use judgment in calling for help. - Avoid restraints but make sure the patient is in a bed with padded side rails - Place the individual in a lateral position with the neck slightly flexed; this will help the saliva drain from the mouth and prevent the tongue from falling backward - Remove all nearby furniture and other hazards from the area - Provide verbal assurance as the individual is  regaining consciousness - Provide the patient with privacy if possible - Call for help and start treatment as ordered by the caregiver    After the Seizure (Postictal Stage)   After a seizure, most patients experience confusion, fatigue, muscle pain and/or a headache. Thus, one should permit the individual to sleep. For the next few days, reassurance is essential. Being calm and helping reorient the person is also of importance.   Most  seizures are painless and end spontaneously. Seizures are not harmful to others but can lead to complications such as stress on the lungs, brain and the heart. Individuals with prior lung problems may develop labored breathing and respiratory distress.

## 2023-12-14 NOTE — ED Notes (Signed)
 Patient is resting comfortably.

## 2023-12-14 NOTE — ED Notes (Signed)
 Pt. Was provided chicken broth and apple juice at 0602.

## 2023-12-16 LAB — LEVETIRACETAM LEVEL: Levetiracetam Lvl: <2 ug/mL — ABNORMAL LOW (ref 10.0–40.0)

## 2023-12-17 LAB — OXCARBAZEPINE (TRILEPTAL), SERUM: Oxcarbazepine Metabolite: 7 ug/mL — ABNORMAL LOW (ref 10–35)

## 2023-12-21 ENCOUNTER — Other Ambulatory Visit: Payer: Self-pay

## 2023-12-25 ENCOUNTER — Other Ambulatory Visit: Payer: Self-pay

## 2023-12-26 ENCOUNTER — Other Ambulatory Visit: Payer: Self-pay

## 2023-12-26 ENCOUNTER — Telehealth: Payer: Self-pay | Admitting: Neurology

## 2023-12-26 NOTE — Telephone Encounter (Unsigned)
 Copied from CRM [id number]. Topic: Clinical - Prescription Issue >> Dec 26, 2023 10:32 AM Shanda MATSU wrote: Reason for CRM: Patient' sister is, Brian Ward, calling in to adv that Medicaid will not cover patient's meds due to other ins, Brian Ward, showing on file for him, caller is wanting to know what she needs to do to have the other ins removed as patient no longer has this other ins. >> Dec 26, 2023 10:35 AM Shanda MATSU wrote: Meds that are not being covered are levETIRAcetam  (KEPPRA ) 750 MG tablet and oxcarbazepine  (TRILEPTAL ) 600 MG tablet, patient is completely out of these meds.

## 2023-12-26 NOTE — Telephone Encounter (Signed)
 Copied from CRM [id number]. Topic: Clinical - Prescription Issue >> Dec 26, 2023 10:32 AM Shanda MATSU wrote: Reason for CRM: Patient' sister is, VERONICA BELL, calling in to adv that Medicaid will not cover patient's meds due to other ins, Sherleen, showing on file for him, caller is wanting to know what she needs to do to have the other ins removed as patient no longer has this other ins. >> Dec 26, 2023 10:35 AM Shanda MATSU wrote: Meds that are not being covered are levETIRAcetam  (KEPPRA ) 750 MG tablet and oxcarbazepine  (TRILEPTAL ) 600 MG tablet, patient is completely out of these meds.

## 2023-12-26 NOTE — Telephone Encounter (Signed)
 Pt has had a change in insurance can we see if a PA is needed for medication thank you  no longer has trillium

## 2023-12-26 NOTE — Telephone Encounter (Signed)
 Pt's girlfriend Lucienne called in this morning . Lucienne stated that the pharmacy sent in a request to get a  refill for the prescription called: oxcarbazepine  (TRILEPTAL ) 600 MG tablet . Pt still waiting to get that refill. Pt is out of that prescription. Thanks

## 2023-12-27 NOTE — Telephone Encounter (Signed)
 Working to see if PA is needed see note below,

## 2024-01-01 ENCOUNTER — Other Ambulatory Visit: Payer: Self-pay

## 2024-01-08 ENCOUNTER — Encounter: Payer: Self-pay | Admitting: Neurology

## 2024-01-08 ENCOUNTER — Ambulatory Visit: Payer: MEDICAID | Admitting: Neurology

## 2024-02-29 ENCOUNTER — Other Ambulatory Visit: Payer: Self-pay

## 2024-06-06 ENCOUNTER — Ambulatory Visit: Admitting: Neurology
